# Patient Record
Sex: Female | Born: 1965 | Race: White | Hispanic: No | Smoking: Former smoker
Health system: Southern US, Community
[De-identification: ages and names within clinical notes are randomized; demographics above are authoritative.]

## PROBLEM LIST (undated history)

## (undated) DIAGNOSIS — Z923 Personal history of irradiation: Secondary | ICD-10-CM

## (undated) DIAGNOSIS — I10 Essential (primary) hypertension: Secondary | ICD-10-CM

## (undated) DIAGNOSIS — E039 Hypothyroidism, unspecified: Secondary | ICD-10-CM

## (undated) DIAGNOSIS — C50919 Malignant neoplasm of unspecified site of unspecified female breast: Secondary | ICD-10-CM

## (undated) DIAGNOSIS — R011 Cardiac murmur, unspecified: Secondary | ICD-10-CM

## (undated) HISTORY — DX: Personal history of irradiation: Z92.3

## (undated) HISTORY — DX: Malignant neoplasm of unspecified site of unspecified female breast: C50.919

## (undated) HISTORY — PX: COLONOSCOPY: SHX174

## (undated) HISTORY — DX: Essential (primary) hypertension: I10

## (undated) HISTORY — PX: WISDOM TOOTH EXTRACTION: SHX21

---

## 1998-02-12 ENCOUNTER — Other Ambulatory Visit: Admission: RE | Admit: 1998-02-12 | Discharge: 1998-02-12 | Payer: Self-pay | Admitting: Obstetrics and Gynecology

## 1999-03-04 ENCOUNTER — Other Ambulatory Visit: Admission: RE | Admit: 1999-03-04 | Discharge: 1999-03-04 | Payer: Self-pay | Admitting: Obstetrics and Gynecology

## 2000-04-05 ENCOUNTER — Other Ambulatory Visit: Admission: RE | Admit: 2000-04-05 | Discharge: 2000-04-05 | Payer: Self-pay | Admitting: Obstetrics and Gynecology

## 2001-04-06 ENCOUNTER — Other Ambulatory Visit: Admission: RE | Admit: 2001-04-06 | Discharge: 2001-04-06 | Payer: Self-pay | Admitting: Obstetrics and Gynecology

## 2003-08-13 ENCOUNTER — Other Ambulatory Visit: Admission: RE | Admit: 2003-08-13 | Discharge: 2003-08-13 | Payer: Self-pay | Admitting: Obstetrics and Gynecology

## 2004-09-07 ENCOUNTER — Other Ambulatory Visit: Admission: RE | Admit: 2004-09-07 | Discharge: 2004-09-07 | Payer: Self-pay | Admitting: Obstetrics and Gynecology

## 2005-10-07 ENCOUNTER — Other Ambulatory Visit: Admission: RE | Admit: 2005-10-07 | Discharge: 2005-10-07 | Payer: Self-pay | Admitting: Obstetrics and Gynecology

## 2006-11-15 ENCOUNTER — Encounter: Admission: RE | Admit: 2006-11-15 | Discharge: 2006-11-15 | Payer: Self-pay | Admitting: Obstetrics and Gynecology

## 2010-04-09 ENCOUNTER — Encounter: Admission: RE | Admit: 2010-04-09 | Discharge: 2010-04-09 | Payer: Self-pay | Admitting: Obstetrics and Gynecology

## 2010-11-21 ENCOUNTER — Encounter: Payer: Self-pay | Admitting: Obstetrics and Gynecology

## 2014-04-08 ENCOUNTER — Ambulatory Visit (HOSPITAL_COMMUNITY): Payer: BC Managed Care – PPO | Attending: Cardiovascular Disease | Admitting: Cardiology

## 2014-04-08 ENCOUNTER — Other Ambulatory Visit (HOSPITAL_COMMUNITY): Payer: Self-pay | Admitting: Obstetrics and Gynecology

## 2014-04-08 DIAGNOSIS — R011 Cardiac murmur, unspecified: Secondary | ICD-10-CM

## 2014-04-08 NOTE — Progress Notes (Signed)
Echo performed. 

## 2018-04-05 ENCOUNTER — Other Ambulatory Visit: Payer: Self-pay | Admitting: Obstetrics and Gynecology

## 2018-04-05 DIAGNOSIS — R17 Unspecified jaundice: Secondary | ICD-10-CM

## 2018-04-13 ENCOUNTER — Other Ambulatory Visit: Payer: Self-pay

## 2018-04-24 ENCOUNTER — Ambulatory Visit
Admission: RE | Admit: 2018-04-24 | Discharge: 2018-04-24 | Disposition: A | Discharge status: Home / Self Care | Payer: BLUE CROSS/BLUE SHIELD | Source: Ambulatory Visit | Attending: Obstetrics and Gynecology | Admitting: Obstetrics and Gynecology

## 2018-04-24 DIAGNOSIS — R17 Unspecified jaundice: Secondary | ICD-10-CM

## 2020-08-31 HISTORY — PX: BREAST BIOPSY: SHX20

## 2020-09-09 ENCOUNTER — Other Ambulatory Visit: Payer: Self-pay | Admitting: Obstetrics and Gynecology

## 2020-09-09 DIAGNOSIS — R928 Other abnormal and inconclusive findings on diagnostic imaging of breast: Secondary | ICD-10-CM

## 2020-09-23 ENCOUNTER — Other Ambulatory Visit: Payer: Self-pay | Admitting: Obstetrics and Gynecology

## 2020-09-23 ENCOUNTER — Ambulatory Visit
Admission: RE | Admit: 2020-09-23 | Discharge: 2020-09-23 | Disposition: A | Discharge status: Home / Self Care | Payer: BLUE CROSS/BLUE SHIELD | Source: Ambulatory Visit | Attending: Obstetrics and Gynecology | Admitting: Obstetrics and Gynecology

## 2020-09-23 ENCOUNTER — Other Ambulatory Visit: Payer: Self-pay

## 2020-09-23 DIAGNOSIS — R928 Other abnormal and inconclusive findings on diagnostic imaging of breast: Secondary | ICD-10-CM

## 2020-09-28 ENCOUNTER — Other Ambulatory Visit: Payer: Self-pay | Admitting: Obstetrics and Gynecology

## 2020-09-28 ENCOUNTER — Ambulatory Visit
Admission: RE | Admit: 2020-09-28 | Discharge: 2020-09-28 | Disposition: A | Discharge status: Home / Self Care | Payer: BC Managed Care – PPO | Source: Ambulatory Visit | Attending: Obstetrics and Gynecology | Admitting: Obstetrics and Gynecology

## 2020-09-28 ENCOUNTER — Other Ambulatory Visit: Payer: Self-pay

## 2020-09-28 DIAGNOSIS — R928 Other abnormal and inconclusive findings on diagnostic imaging of breast: Secondary | ICD-10-CM

## 2020-09-30 ENCOUNTER — Telehealth: Payer: Self-pay | Admitting: Oncology

## 2020-09-30 NOTE — Telephone Encounter (Signed)
LVM for patient to return call in reference to afternoon [facility] appointment for 12/8, mailing packet out

## 2020-10-01 ENCOUNTER — Encounter: Payer: Self-pay | Admitting: *Deleted

## 2020-10-01 DIAGNOSIS — Z170 Estrogen receptor positive status [ER+]: Secondary | ICD-10-CM | POA: Insufficient documentation

## 2020-10-01 DIAGNOSIS — C50411 Malignant neoplasm of upper-outer quadrant of right female breast: Secondary | ICD-10-CM

## 2020-10-06 ENCOUNTER — Other Ambulatory Visit: Payer: Self-pay | Admitting: *Deleted

## 2020-10-06 DIAGNOSIS — Z170 Estrogen receptor positive status [ER+]: Secondary | ICD-10-CM

## 2020-10-06 DIAGNOSIS — C50411 Malignant neoplasm of upper-outer quadrant of right female breast: Secondary | ICD-10-CM

## 2020-10-06 NOTE — Progress Notes (Signed)
[place]  [phone] [phone]     ID: Ariel Bell DOB: [birth year]  MR#: [id number]  MOL#:[id number]  Patient Care Team: Patient, No Pcp Per as PCP - General (General Practice) Mauro Kaufmann, RN as Oncology Nurse Navigator Rockwell Germany, RN as Oncology Nurse Navigator Gery Pray, MD as Consulting Physician (Radiation Oncology) Attila Mccarthy, Virgie Dad, MD as Consulting Physician (Oncology) Rolm Bookbinder, MD as Consulting Physician (General Surgery) Ulla Gallo, MD as Consulting Physician (Dermatology) Chauncey Cruel, MD OTHER MD:  CHIEF COMPLAINT: estrogen receptor positive breast cancer  CURRENT TREATMENT: Awaiting definitive surgery   HISTORY OF CURRENT ILLNESS: Ariel Bell had routine screening mammography on 09/02/2020 showing a possible abnormality in the right breast. She underwent right diagnostic mammography with tomography and right breast ultrasonography at [facility] on 09/23/2020 showing: breast density category C; 5 mm mass involving upper-outer right breast with a vague shadowing focus which is likely the sonographic correlate; no pathologic right axillary lymphadenopathy.  Accordingly on 09/28/2020 she proceeded to biopsy of the right breast area in question. The pathology from this procedure ([id number]) showed: invasive and in situ mammary carcinoma, e-cadherin positive, grade 2. Prognostic indicators significant for: estrogen receptor, 100% positive and progesterone receptor, 90% positive, both with strong staining intensity. Proliferation marker Ki67 at 10%. HER2 equivocal by immunohistochemistry (2+), but negative by fluorescent in situ hybridization with a signals ratio 1.41 and number per cell 2.05.  The patient's subsequent history is as detailed below.   INTERVAL HISTORY: [name] was evaluated in the multidisciplinary breast cancer clinic on 10/07/2020 accompanied by her sister Ariel Bell. Her case was also  presented at the multidisciplinary breast cancer conference on the same day. At that time a preliminary plan was proposed: Breast conserving surgery with sentinel lymph node sampling, adjuvant radiation, consideration of antiestrogens   REVIEW OF SYSTEMS: There were no specific symptoms leading to the original mammogram, which was routinely scheduled. The patient denies unusual headaches, visual changes, nausea, vomiting, stiff neck, dizziness, or gait imbalance. There has been no cough, phlegm production, or pleurisy, no chest pain or pressure, and no change in bowel or bladder habits. The patient denies fever, rash, bleeding, unexplained fatigue or unexplained weight loss. A detailed review of systems was otherwise entirely negative.   COVID 19 VACCINATION STATUS:    PAST MEDICAL HISTORY: Past Medical History:  Diagnosis Date  . Breast cancer ([place])   . Hypertension     PAST SURGICAL HISTORY: History reviewed. No pertinent surgical history.  FAMILY HISTORY: Family History  Problem Relation Age of Onset  . COPD Father    Her father died in his 24's from COPD/E. Her mother is age 58 as of 09/2020. [name] has two brothers and one sister. There is no family history of cancer to her knowledge.    GYNECOLOGIC HISTORY:  No LMP recorded. Menarche: 54 years old [place] P 0 LMP age 56 HRT never used  Hysterectomy? no BSO? 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 She is divorced. She is currently staying with her niece Ariel Bell because she just sold her house, but she is planning to move out as soon as she finds her own place to live.    ADVANCED DIRECTIVES: not in place, plans to name her sister Ariel Bell as her HCPOA.   HEALTH MAINTENANCE:     Colonoscopy: 2018?  PAP: up to date  Bone density: osteopenia  No Known Allergies  Current Outpatient Medications  Medication Sig Dispense Refill  .  hydrochlorothiazide (HYDRODIURIL) 25 MG tablet Take 25 mg by mouth daily.    Marland Kitchen ibandronate (BONIVA) 150 MG tablet Take 150 mg by mouth every 30 (thirty) days.    Marland Kitchen SYNTHROID 75 MCG tablet Take 75 mcg by mouth daily.     No current facility-administered medications for this visit.    OBJECTIVE: White woman in no acute distress  Vitals:   10/07/20 1311  BP: (!) 148/74  Pulse: 76  Resp: 17  Temp: 98.6 F (37 C)  SpO2: 100%     Body mass index is 25.35 kg/m.   Wt Readings from Last 3 Encounters:  10/07/20 138 lb 9.6 oz (62.9 kg)      ECOG FS:1 - Symptomatic but completely ambulatory  Ocular: Sclerae unicteric, pupils round and equal Ear-nose-throat: Wearing a mask Lymphatic: No cervical or supraclavicular adenopathy Lungs no rales or rhonchi Heart regular rate and rhythm Abd soft, nontender, positive bowel sounds MSK no focal spinal tenderness, no joint edema Neuro: non-focal, well-oriented, appropriate affect Breasts: The right breast is status Bell recent biopsy.  There is no ecchymosis or palpable mass, and no skin or nipple changes of concern.  The left breast is benign.  Both axillae are benign   LAB RESULTS:  CMP     Component Value Date/Time   NA 141 10/07/2020 1235   K 3.6 10/07/2020 1235   CL 101 10/07/2020 1235   CO2 29 10/07/2020 1235   GLUCOSE 96 10/07/2020 1235   BUN 13 10/07/2020 1235   CREATININE 0.96 10/07/2020 1235   CALCIUM 10.1 10/07/2020 1235   PROT 7.4 10/07/2020 1235   ALBUMIN 4.2 10/07/2020 1235   AST 21 10/07/2020 1235   ALT 16 10/07/2020 1235   ALKPHOS 68 10/07/2020 1235   BILITOT 1.4 (H) 10/07/2020 1235   GFRNONAA >60 10/07/2020 1235    No results found for: TOTALPROTELP, ALBUMINELP, A1GS, A2GS, BETS, BETA2SER, GAMS, MSPIKE, SPEI  Lab Results  Component Value Date   WBC 4.7 10/07/2020   NEUTROABS 2.5 10/07/2020   HGB 14.1 10/07/2020   HCT 42.1 10/07/2020   MCV 99.5 10/07/2020   PLT 206 10/07/2020    No results found for:  LABCA2  No components found for: XLKGMW102  No results for input(s): INR in the last 168 hours.  No results found for: LABCA2  No results found for: VOZ366  No results found for: YQI347  No results found for: QQV956  No results found for: CA2729  No components found for: HGQUANT  No results found for: CEA1 / No results found for: CEA1   No results found for: AFPTUMOR  No results found for: CHROMOGRNA  No results found for: KPAFRELGTCHN, LAMBDASER, KAPLAMBRATIO (kappa/lambda light chains)  No results found for: HGBA, HGBA2QUANT, HGBFQUANT, HGBSQUAN (Hemoglobinopathy evaluation)   No results found for: LDH  No results found for: IRON, TIBC, IRONPCTSAT (Iron and TIBC)  No results found for: FERRITIN  Urinalysis No results found for: COLORURINE, APPEARANCEUR, LABSPEC, PHURINE, GLUCOSEU, HGBUR, BILIRUBINUR, KETONESUR, PROTEINUR, UROBILINOGEN, NITRITE, LEUKOCYTESUR   STUDIES: US BREAST LTD UNI RIGHT INC AXILLA  Result Date: 09/23/2020 CLINICAL DATA:  Recall from screening mammography with tomosynthesis, possible mass or asymmetry in the UPPER RIGHT breast at POSTERIOR depth visible only on the MLO images. EXAM: DIGITAL DIAGNOSTIC RIGHT MAMMOGRAM WITH CAD AND TOMO ULTRASOUND RIGHT BREAST COMPARISON:  Previous exam(s). ACR Breast Density Category c: The breast tissue is heterogeneously  dense, which may obscure small masses. FINDINGS: Tomosynthesis and synthesized digital 2D spot compression MLO view of the area of concern in the RIGHT breast, tomosynthesis and synthesized digital 2D full field mediolateral and laterally exaggerated CC views of the RIGHT breast, and tomosynthesis and synthesized digital 2D spot compression laterally exaggerated CC view of the area of concern in the RIGHT breast were obtained. Mammographic images were processed with CAD. The mass or asymmetry persists on the spot compression MLO images. On the laterally exaggerated CC images, the mass localizes to  the OUTER breast and on the full field mediolateral images, the mass remains in the UPPER breast, indicating its location in the [place]. The mass measures approximately 5 mm and has vague margins on the spot compression images. There is no associated architectural distortion or suspicious calcifications. Targeted ultrasound is performed, showing a vague shadowing focus at the 10 o'clock position approximately 5 cm from nipple at POSTERIOR depth, best seen on the images obtained with harmonics, measuring approximately 3 mm, likely the sonographic correlate for the mammographic mass. Sonographic evaluation of the RIGHT axilla demonstrates no pathologic lymphadenopathy. IMPRESSION: 1. New suspicious 5 mm mass involving the UPPER OUTER QUADRANT of the RIGHT breast with a vague shadowing focus which is the likely sonographic correlate. 2. No pathologic RIGHT axillary lymphadenopathy. RECOMMENDATION: Since the mass is more conspicuous on the mammogram as opposed to the vague focus on ultrasound, stereotactic tomosynthesis core needle biopsy of the RIGHT breast mass is recommended. The stereotactic core needle biopsy procedure was discussed with the patient and her questions were answered. She wishes to proceed and the biopsy has been scheduled at her convenience. I have discussed the findings and recommendations with the patient. BI-RADS CATEGORY  4: Suspicious. Electronically Signed   By: Evangeline Dakin M.D.   On: 09/23/2020 11:08   MM DIAG BREAST TOMO UNI RIGHT  Result Date: 09/23/2020 CLINICAL DATA:  Recall from screening mammography with tomosynthesis, possible mass or asymmetry in the UPPER RIGHT breast at POSTERIOR depth visible only on the MLO images. EXAM: DIGITAL DIAGNOSTIC RIGHT MAMMOGRAM WITH CAD AND TOMO ULTRASOUND RIGHT BREAST COMPARISON:  Previous exam(s). ACR Breast Density Category c: The breast tissue is heterogeneously dense, which may obscure small masses. FINDINGS: Tomosynthesis  and synthesized digital 2D spot compression MLO view of the area of concern in the RIGHT breast, tomosynthesis and synthesized digital 2D full field mediolateral and laterally exaggerated CC views of the RIGHT breast, and tomosynthesis and synthesized digital 2D spot compression laterally exaggerated CC view of the area of concern in the RIGHT breast were obtained. Mammographic images were processed with CAD. The mass or asymmetry persists on the spot compression MLO images. On the laterally exaggerated CC images, the mass localizes to the OUTER breast and on the full field mediolateral images, the mass remains in the UPPER breast, indicating its location in the [place]. The mass measures approximately 5 mm and has vague margins on the spot compression images. There is no associated architectural distortion or suspicious calcifications. Targeted ultrasound is performed, showing a vague shadowing focus at the 10 o'clock position approximately 5 cm from nipple at POSTERIOR depth, best seen on the images obtained with harmonics, measuring approximately 3 mm, likely the sonographic correlate for the mammographic mass. Sonographic evaluation of the RIGHT axilla demonstrates no pathologic lymphadenopathy. IMPRESSION: 1. New suspicious 5 mm mass involving the UPPER OUTER QUADRANT of the RIGHT breast with a vague shadowing focus which is the likely sonographic  correlate. 2. No pathologic RIGHT axillary lymphadenopathy. RECOMMENDATION: Since the mass is more conspicuous on the mammogram as opposed to the vague focus on ultrasound, stereotactic tomosynthesis core needle biopsy of the RIGHT breast mass is recommended. The stereotactic core needle biopsy procedure was discussed with the patient and her questions were answered. She wishes to proceed and the biopsy has been scheduled at her convenience. I have discussed the findings and recommendations with the patient. BI-RADS CATEGORY  4: Suspicious. Electronically  Signed   By: Evangeline Dakin M.D.   On: 09/23/2020 11:08   MM CLIP PLACEMENT RIGHT  Result Date: 09/28/2020 CLINICAL DATA:  Status Bell stereotactic guided core biopsy of RIGHT breast mass. EXAM: DIAGNOSTIC RIGHT MAMMOGRAM Bell STEREOTACTIC BIOPSY COMPARISON:  Previous exam(s). FINDINGS: Mammographic images were obtained following stereotactic guided biopsy of mass in the UPPER-OUTER QUADRANT of the RIGHT breast and placement of a coil shaped clip. The biopsy marking clip is in expected position at the site of biopsy. IMPRESSION: Appropriate positioning of the coil shaped biopsy marking clip at the site of biopsy in the posterior UPPER OUTER QUADRANT RIGHT breast. Final Assessment: Bell Procedure Mammograms for Marker Placement Electronically Signed   By: Nolon Nations M.D.   On: 09/28/2020 11:16   MM RT BREAST BX W LOC DEV 1ST LESION IMAGE BX SPEC STEREO GUIDE  Addendum Date: 09/29/2020   ADDENDUM REPORT: 09/29/2020 11:41 ADDENDUM: Pathology revealed GRADE II INVASIVE MAMMARY CARCINOMA, MAMMARY CARCINOMA IN SITU WITH NECROSIS of the RIGHT breast, upper outer quadrant. This was found to be concordant by Dr. Nolon Nations. Pathology results were discussed with the patient by telephone. The patient reported doing well after the biopsy with tenderness at the site. Bell biopsy instructions and care were reviewed and questions were answered. The patient was encouraged to call [facility] for any additional concerns. My direct phone number was provided. The patient was referred to [facility] on October 07, 2020. Pathology results reported by Terie Purser, RN on 09/29/2020. Electronically Signed   By: Nolon Nations M.D.   On: 09/29/2020 11:41   Result Date: 09/29/2020 CLINICAL DATA:  Patient presents for stereotactic guided core biopsy of RIGHT breast mass. EXAM: RIGHT BREAST STEREOTACTIC CORE  NEEDLE BIOPSY COMPARISON:  Previous exams. FINDINGS: The patient and I discussed the procedure of stereotactic-guided biopsy including benefits and alternatives. We discussed the high likelihood of a successful procedure. We discussed the risks of the procedure including infection, bleeding, tissue injury, clip migration, and inadequate sampling. Informed written consent was given. The usual time out protocol was performed immediately prior to the procedure. Using sterile technique and 1% lidocaine and 1% lidocaine with epinephrine as local anesthetic, under stereotactic guidance, a 9 gauge vacuum assisted device was used to perform core needle biopsy of mass in the posterior UPPER-OUTER QUADRANT of the RIGHT breast using a 9 LATERAL to MEDIAL approach. Specimen radiograph was performed showing calcifications in tissue samples. Specimens with calcifications are identified for pathology. Lesion quadrant: UPPER-OUTER QUADRANT RIGHT breast At the conclusion of the procedure, coil shaped tissue marker clip was deployed into the biopsy cavity. Follow-up 2-view mammogram was performed and dictated separately. IMPRESSION: Stereotactic-guided biopsy of RIGHT breast mass. No apparent complications. Electronically Signed: By: Nolon Nations M.D. On: 09/28/2020 11:15     ELIGIBLE FOR AVAILABLE RESEARCH PROTOCOL: AET  ASSESSMENT: 54 y.o. [place] woman status Bell right breast upper outer quadrant biopsy 09/28/2020 for  a clinical T1a N0, stage IA invasive ductal carcinoma, grade 2, estrogen and progesterone receptor positive, HER-2 negative, with an MIB-1 of 10%.  (1) definitive surgery pending  (2) adjuvant radiation to follow  (3) consider antiestrogens for prophylaxis  PLAN: I met today with Shaneisha to review her new diagnosis. Specifically we discussed the biology of her breast cancer, its diagnosis, staging, treatment  options and prognosis. We first reviewed the fact that cancer is not one disease but  more than 100 different diseases and that it is important to keep them separate-- otherwise when friends and relatives discuss their own cancer experiences with [place] confusion can result. Similarly we explained that if breast cancer spreads to the bone or liver, the patient would not have bone cancer or liver cancer, but breast cancer in the bone and breast cancer in the liver: one cancer in three places-- not 3 different cancers which otherwise would have to be treated in 3 different ways.  We discussed the difference between local and systemic therapy. In terms of loco-regional treatment, lumpectomy plus radiation is equivalent to mastectomy as far as survival is concerned. For this reason, and because the cosmetic results are generally superior, we recommend breast conserving surgery. We also noted that in terms of sequencing of treatments, whether systemic therapy or surgery is done first does not affect the ultimate outcome.  We then discussed the rationale for systemic therapy. There is some risk that this cancer may have already spread to other parts of her body. Patients frequently ask at this point about bone scans, CAT scans and PET scans to find out if they have occult breast cancer somewhere else. The problem is that in early stage disease we are much more likely to find false positives then true cancers and this would expose the patient to unnecessary procedures as well as unnecessary radiation. Scans cannot answer the question the patient really would like to know, which is whether she has microscopic disease elsewhere in her body. For those reasons we do not recommend them.  Of course we would proceed to aggressive evaluation of any symptoms that might suggest metastatic disease, but that is not the case here.  Next we went over the options for systemic therapy which are anti-estrogens, anti-HER-2 immunotherapy, and chemotherapy.   Dorthia does not meet criteria for anti-HER-2  immunotherapy.  Chemotherapy is not used in T1a cases especially if they are estrogen receptor positive  Her prognosis with local treatment only is so good that antiestrogens are optional.  She may take them particularly for prophylaxis, namely to prevent a future breast cancer from developing.  We will discuss this option after she has completed her surgery and radiation treatments.  Leonie has a good understanding of the overall plan. She agrees with it. She knows the goal of treatment in her case is cure. She will call with any problems that may develop before her next visit here.  Total encounter time 65 minutes.Sarajane Jews C. Hakeen Shipes, MD 10/08/2020 7:35 AM Medical Oncology and Hematology [facility] [place], [place] [address] Tel. [phone]    Fax. [phone]   This document serves as a record of services personally performed by Lurline Del, MD. It was created on his behalf by Wilburn Mylar, a trained medical scribe. The creation of this record is based on the scribe's personal observations and the provider's statements to them.   Lindie Spruce MD, have reviewed the above documentation for accuracy and  completeness, and I agree with the above.    *Total Encounter Time as defined by the Centers for Medicare and Medicaid Services includes, in addition to the face-to-face time of a patient visit (documented in the note above) non-face-to-face time: obtaining and reviewing outside history, ordering and reviewing medications, tests or procedures, care coordination (communications with other health care professionals or caregivers) and documentation in the medical record.

## 2020-10-07 ENCOUNTER — Ambulatory Visit: Payer: BC Managed Care – PPO | Attending: General Surgery | Admitting: Physical Therapy

## 2020-10-07 ENCOUNTER — Encounter: Payer: Self-pay | Admitting: Oncology

## 2020-10-07 ENCOUNTER — Inpatient Hospital Stay: Payer: BC Managed Care – PPO

## 2020-10-07 ENCOUNTER — Ambulatory Visit
Admission: RE | Admit: 2020-10-07 | Discharge: 2020-10-07 | Disposition: A | Discharge status: Home / Self Care | Payer: BC Managed Care – PPO | Source: Ambulatory Visit | Attending: Radiation Oncology | Admitting: Radiation Oncology

## 2020-10-07 ENCOUNTER — Encounter: Payer: Self-pay | Admitting: General Practice

## 2020-10-07 ENCOUNTER — Inpatient Hospital Stay: Payer: BC Managed Care – PPO | Attending: Oncology | Admitting: Oncology

## 2020-10-07 ENCOUNTER — Other Ambulatory Visit: Payer: Self-pay | Admitting: General Surgery

## 2020-10-07 ENCOUNTER — Other Ambulatory Visit: Payer: Self-pay

## 2020-10-07 ENCOUNTER — Encounter: Payer: Self-pay | Admitting: Physical Therapy

## 2020-10-07 ENCOUNTER — Other Ambulatory Visit: Payer: Self-pay | Admitting: *Deleted

## 2020-10-07 VITALS — BP 148/74 | HR 76 | Temp 98.6°F | Resp 17 | Ht 62.0 in | Wt 138.6 lb

## 2020-10-07 DIAGNOSIS — C50411 Malignant neoplasm of upper-outer quadrant of right female breast: Secondary | ICD-10-CM

## 2020-10-07 DIAGNOSIS — I10 Essential (primary) hypertension: Secondary | ICD-10-CM | POA: Diagnosis not present

## 2020-10-07 DIAGNOSIS — Z170 Estrogen receptor positive status [ER+]: Secondary | ICD-10-CM | POA: Insufficient documentation

## 2020-10-07 DIAGNOSIS — R293 Abnormal posture: Secondary | ICD-10-CM

## 2020-10-07 LAB — CBC WITH DIFFERENTIAL (CANCER CENTER ONLY)
Abs Immature Granulocytes: 0 10*3/uL (ref 0.00–0.07)
Basophils Absolute: 0.1 10*3/uL (ref 0.0–0.1)
Basophils Relative: 1 %
Eosinophils Absolute: 0.2 10*3/uL (ref 0.0–0.5)
Eosinophils Relative: 4 %
HCT: 42.1 % (ref 36.0–46.0)
Hemoglobin: 14.1 g/dL (ref 12.0–15.0)
Immature Granulocytes: 0 %
Lymphocytes Relative: 35 %
Lymphs Abs: 1.7 10*3/uL (ref 0.7–4.0)
MCH: 33.3 pg (ref 26.0–34.0)
MCHC: 33.5 g/dL (ref 30.0–36.0)
MCV: 99.5 fL (ref 80.0–100.0)
Monocytes Absolute: 0.3 10*3/uL (ref 0.1–1.0)
Monocytes Relative: 7 %
Neutro Abs: 2.5 10*3/uL (ref 1.7–7.7)
Neutrophils Relative %: 53 %
Platelet Count: 206 10*3/uL (ref 150–400)
RBC: 4.23 MIL/uL (ref 3.87–5.11)
RDW: 12.5 % (ref 11.5–15.5)
WBC Count: 4.7 10*3/uL (ref 4.0–10.5)
nRBC: 0 % (ref 0.0–0.2)

## 2020-10-07 LAB — CMP (CANCER CENTER ONLY)
ALT: 16 U/L (ref 0–44)
AST: 21 U/L (ref 15–41)
Albumin: 4.2 g/dL (ref 3.5–5.0)
Alkaline Phosphatase: 68 U/L (ref 38–126)
Anion gap: 11 (ref 5–15)
BUN: 13 mg/dL (ref 6–20)
CO2: 29 mmol/L (ref 22–32)
Calcium: 10.1 mg/dL (ref 8.9–10.3)
Chloride: 101 mmol/L (ref 98–111)
Creatinine: 0.96 mg/dL (ref 0.44–1.00)
GFR, Estimated: >60 mL/min (ref >60)
Glucose, Bld: 96 mg/dL (ref 70–99)
Potassium: 3.6 mmol/L (ref 3.5–5.1)
Sodium: 141 mmol/L (ref 135–145)
Total Bilirubin: 1.4 mg/dL — ABNORMAL HIGH (ref 0.3–1.2)
Total Protein: 7.4 g/dL (ref 6.5–8.1)

## 2020-10-07 LAB — GENETIC SCREENING ORDER

## 2020-10-07 NOTE — Progress Notes (Signed)
[place] Psychosocial Distress Screening Spiritual Care  Shadowed by Counseling Intern Gaylyn Rong, met with Ariel Bell and her sister in Breast Multidisciplinary Clinic to introduce [facility] team/resources, reviewing distress screen per protocol.  The patient scored a 5 on the Psychosocial Distress Thermometer which indicates moderate distress. Also assessed for distress and other psychosocial needs.   ONCBCN DISTRESS SCREENING 10/07/2020  Screening Type Initial Screening  Distress experienced in past week (1-10) 5  Practical problem type Work/school  Emotional problem type Nervousness/Anxiety;Adjusting to illness  Information Concerns Type Lack of info about diagnosis;Lack of info about treatment  Referral to support programs Yes   Ariel Bell reports good support from her sister and family. She notes that meeting team and learning treatment plan helped reduce her distress, describing herself as "confident" in her care.  Follow up needed: No. Per Ariel Bell, no other needs at this time, but she is aware of ongoing team availability should needs arise or circumstances change.   [place], [place], [facility] Pager [phone] Voicemail [phone]

## 2020-10-07 NOTE — Patient Instructions (Signed)

## 2020-10-07 NOTE — Therapy (Signed)
[place], [place], [address] Phone: [phone]   Fax:  [phone]  Physical Therapy Evaluation  Patient Details  Name: Ariel Bell MRN: [id number] Date of Birth: 1966-04-28 Referring Provider (PT): Dr. Rolm Bookbinder   Encounter Date: 10/07/2020   PT End of Session - 10/07/20 1805    Visit Number 1    Number of Visits 2    Date for PT Re-Evaluation 12/02/20    PT Start Time 6010    PT Stop Time 9323   Also saw pt from 1356-1400 for a total of 36 min   PT Time Calculation (min) 32 min    Activity Tolerance Patient tolerated treatment well    Behavior During Therapy [facility] for tasks assessed/performed           Past Medical History:  Diagnosis Date  . Breast cancer ([place])   . Hypertension     History reviewed. No pertinent surgical history.  There were no vitals filed for this visit.    Subjective Assessment - 10/07/20 1753    Subjective Patient reports she is here today to be seen by her medical team for her newly diagnosed right breast cancer.    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 It measures 3 mm and is located in the upper outer quadrant. It is ER/PR positive and HER2 negative with a Ki67 of 10%.    Patient Stated Goals Reduce lymphedema risk and learn post op shoulder ROM HEP    Currently in Pain? No/denies              [facility] PT Assessment - 10/07/20 0001      Assessment   Medical Diagnosis Right breast cancer    Referring Provider (PT) Dr. Rolm Bookbinder    Onset Date/Surgical Date 09/02/20    Hand Dominance Right    Prior Therapy none      Precautions   Precautions Other (comment)    Precaution Comments active cancer      Restrictions   Weight Bearing Restrictions No      Balance Screen   Has the patient fallen in the past 6 months  No    Has the patient had a decrease in activity level because of a fear of falling?  No    Is the patient reluctant to leave their home because of a fear of falling?  No      Home Environment   Living Environment Private residence    Living Arrangements Other relatives   Niece   Available Help at Discharge Family      Prior Function   Level of Independence Independent    Vocation Full time employment    Printmaker full time; part-time at [employer] She used to walk regularly but nothing in the past year      Cognition   Overall Cognitive Status Within Functional Limits for tasks assessed      Posture/Postural Control   Posture/Postural Control Postural limitations    Postural Limitations Rounded Shoulders;Forward head      ROM / Strength   AROM / PROM / Strength AROM;Strength      AROM   Overall AROM Comments Cervical AROM is WNL    AROM Assessment Site Shoulder    Right/Left Shoulder Right;Left    Right Shoulder Extension 44  Degrees    Right Shoulder Flexion 154 Degrees    Right Shoulder ABduction 155 Degrees    Right Shoulder Internal Rotation 62 Degrees    Right Shoulder External Rotation 87 Degrees    Left Shoulder Extension 42 Degrees    Left Shoulder Flexion 138 Degrees    Left Shoulder ABduction 150 Degrees    Left Shoulder Internal Rotation 58 Degrees    Left Shoulder External Rotation 88 Degrees      Strength   Overall Strength Within functional limits for tasks performed             LYMPHEDEMA/ONCOLOGY QUESTIONNAIRE - 10/07/20 0001      Type   Cancer Type Right breast cancer      Lymphedema Assessments   Lymphedema Assessments Upper extremities      Right Upper Extremity Lymphedema   10 cm Proximal to Olecranon Process 26 cm    Olecranon Process 23 cm    10 cm Proximal to Ulnar Styloid Process 21.3 cm    Just Proximal to Ulnar Styloid Process 14.8 cm    Across Hand at [employer] 17.8 cm    At [place] of 2nd  Digit 6.1 cm      Left Upper Extremity Lymphedema   10 cm Proximal to Olecranon Process 26.5 cm    Olecranon Process 23.1 cm    10 cm Proximal to Ulnar Styloid Process 20.5 cm    Just Proximal to Ulnar Styloid Process 14.7 cm    Across Hand at [employer] 17.8 cm    At [place] of 2nd Digit 6.1 cm           L-DEX FLOWSHEETS - 10/07/20 1800      L-DEX LYMPHEDEMA SCREENING   Measurement Type Unilateral    POSITION  Standing    DOMINANT SIDE Right    At Risk Side Right    BASELINE SCORE (UNILATERAL) -13   Attempted 3x as she scored out of range but it was consisten               [place] - 10/07/20 0001    Open a tight or new jar No difficulty    Do heavy household chores (wash walls, wash floors) No difficulty    Carry a shopping bag or briefcase No difficulty    Wash your back No difficulty    Use a knife to cut food No difficulty    Recreational activities in which you take some force or impact through your arm, shoulder, or hand (golf, hammering, tennis) No difficulty    During the past week, to what extent has your arm, shoulder or hand problem interfered with your normal social activities with family, friends, neighbors, or groups? Not at all    During the past week, to what extent has your arm, shoulder or hand problem limited your work or other regular daily activities Not at all    Arm, shoulder, or hand pain. None    Tingling (pins and needles) in your arm, shoulder, or hand None    Difficulty Sleeping No difficulty    DASH Score 0 %            Objective measurements completed on examination: See above findings.       Patient was instructed today in a home exercise program today for post op shoulder range of motion. These included active assist shoulder flexion in sitting, scapular retraction, wall walking with shoulder abduction, and hands behind head  external rotation.  She was encouraged to do these twice a day, holding 3 seconds and repeating 5 times  when permitted by her physician.            PT Education - 10/07/20 1804    Education Details Lymphedema risk reduction and post op shoulder ROM HEP    Person(s) Educated Patient;Other (comment)   sister   Methods Explanation;Demonstration;Handout    Comprehension Verbalized understanding;Returned demonstration               PT Long Term Goals - 10/07/20 1845      PT LONG TERM GOAL #1   Title Patient will demonstrate she has regained full shoulder ROM and function post operatively compared to baselines.    Time 8    Period Weeks    Status New    Target Date 12/02/20           Breast Clinic Goals - 10/07/20 1845      Patient will be able to verbalize understanding of pertinent lymphedema risk reduction practices relevant to her diagnosis specifically related to skin care.   Time 1    Period Days    Status Achieved      Patient will be able to return demonstrate and/or verbalize understanding of the post-op home exercise program related to regaining shoulder range of motion.   Time 1    Period Days    Status Achieved      Patient will be able to verbalize understanding of the importance of attending the postoperative After Breast Cancer Class for further lymphedema risk reduction education and therapeutic exercise.   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 It measures 3 mm and is located in the upper outer quadrant. It is ER/PR positive and HER2 negative with a Ki67 of 10%. Her multidisciplinary medical team met prior to her assessments to determine a recommended treatment plan. She is planning to have a right lumpectomy and sentinel node biopsy followed by radiation and anti-estrogen therapy. She will benefit from a post op PT reassessment to determine needs and L-Dex screens every  3 months for 2 years to detect subclinical lymphedema.    Stability/Clinical Decision Making Stable/Uncomplicated    Clinical Decision Making Low    Rehab Potential Excellent    PT Frequency --   Eval and 1 f/u visit   PT Treatment/Interventions Therapeutic exercise;Patient/family education;ADLs/Self Care Home Management    PT Next Visit Plan Will reassess 3-4 weeks post op    PT Home Exercise Plan Post op shoulder ROM HEP    Consulted and Agree with Plan of Care Patient;Family member/caregiver    Family Member Consulted sister           Patient will benefit from skilled therapeutic intervention in order to improve the following deficits and impairments:  Postural dysfunction, Decreased range of motion, Decreased knowledge of precautions, Impaired UE functional use, Pain  Visit Diagnosis: Malignant neoplasm of upper-outer quadrant of right breast in female, estrogen receptor positive ([facility]) - Plan: PT plan of care cert/re-cert  Abnormal posture - Plan: PT plan of care cert/re-cert   Patient will follow up at outpatient cancer rehab 3-4 weeks following surgery.  If the patient requires physical  therapy at that time, a specific plan will be dictated and sent to the referring physician for approval. The patient was educated today on appropriate basic range of motion exercises to begin post operatively and the importance of attending the After Breast Cancer class following surgery.  Patient was educated today on lymphedema risk reduction practices as it pertains to recommendations that will benefit the patient immediately following surgery.  She verbalized good understanding.      Problem List Patient Active Problem List   Diagnosis Date Noted  . Malignant neoplasm of upper-outer quadrant of right breast in female, estrogen receptor positive ([place]) 10/01/2020   [name], PT 10/07/20 6:49 PM  [place], [place], [address] Phone: [phone]   Fax:  [phone]  Name: Ariel Bell MRN: [id number] Date of Birth: 06/25/1966

## 2020-10-07 NOTE — Progress Notes (Signed)
Radiation Oncology         [phone]) [phone] ________________________________  Multidisciplinary Breast Oncology Clinic [facility]) Initial Outpatient Consultation  Name: Ariel Bell MRN: [id number]  Date: 10/07/2020  DOB: October 05, 1966  YH:CWCBJSE, No Pcp Per  Rolm Bookbinder, MD   REFERRING PHYSICIAN: Rolm Bookbinder, MD  DIAGNOSIS: The encounter diagnosis was Malignant neoplasm of upper-outer quadrant of right breast in female, estrogen receptor positive ([place]).  Stage T1A, N0, Mx, Right Breast UOQ, Invasive Ductal Carcinoma with DCIS, ER+ / PR+ / Her2-, Grade 2    ICD-10-CM   1. Malignant neoplasm of upper-outer quadrant of right breast in female, estrogen receptor positive ([place])  C50.411    Z17.0     HISTORY OF PRESENT ILLNESS::Ariel Bell is a 54 y.o. female who is presenting to the office today for evaluation of her newly diagnosed breast cancer. She is accompanied by a good friend. She is doing well overall.   She had routine screening mammography on 09/02/2020 that showed a possible mass in the right breast. She underwent unilateral diagnostic mammography with tomography and right breast ultrasonography at [facility] on 09/23/2020 that showed a new suspicious 5 mm mass involving the upper outer quadrant of the right breast with a vague shadowing focus, which was likely the sonographic correlate. There was no pathologic right axillary lymphadenopathy.  Biopsy on 09/28/2020 showed: grade 2 invasive mammary carcinoma and mammary carcinoma in situ with necrosis. E-cadherin was strongly positive, consistent with a ductal phenotype. Prognostic indicators were significant for: estrogen receptor, 100% positive and progesterone receptor, 90% positive, both with strong staining intensities. Proliferation marker Ki67 at 10%. HER2 negative.  Menarche: 54 years old Age at first live birth: N/A GP: 0 LMP: Age 67 Contraceptive: None HRT: None   The patient was referred today for  presentation in the multidisciplinary conference.  Radiology studies and pathology slides were presented there for review and discussion of treatment options.  A consensus was discussed regarding potential next steps.  PREVIOUS RADIATION THERAPY: No  PAST MEDICAL HISTORY:  Past Medical History:  Diagnosis Date  . Breast cancer ([place])   . Hypertension     PAST SURGICAL HISTORY:No past surgical history on file.  FAMILY HISTORY:  Family History  Problem Relation Age of Onset  . COPD Father     SOCIAL HISTORY:  Social History   Socioeconomic History  . Marital status: Married    Spouse name: Not on file  . Number of children: Not on file  . Years of education: Not on file  . Highest education level: Not on file  Occupational History  . Not on file  Tobacco Use  . Smoking status: Not on file  Substance and Sexual Activity  . Alcohol use: Not on file  . Drug use: Not on file  . Sexual activity: Not on file  Other Topics Concern  . Not on file  Social History Narrative  . Not on file   Social Determinants of Health   Financial Resource Strain:   . Difficulty of Paying Living Expenses: Not on file  Food Insecurity:   . Worried About Charity fundraiser in the Last Year: Not on file  . Ran Out of Food in the Last Year: Not on file  Transportation Needs:   . Lack of Transportation (Medical): Not on file  . Lack of Transportation (Non-Medical): Not on file  Physical Activity:   . Days of Exercise per Week: Not on file  . Minutes of Exercise per  Session: Not on file  Stress:   . Feeling of Stress : Not on file  Social Connections:   . Frequency of Communication with Friends and Family: Not on file  . Frequency of Social Gatherings with Friends and Family: Not on file  . Attends Religious Services: Not on file  . Active Member of Clubs or Organizations: Not on file  . Attends Archivist Meetings: Not on file  . Marital Status: Not on file    ALLERGIES: No  Known Allergies  MEDICATIONS:  Current Outpatient Medications  Medication Sig Dispense Refill  . hydrochlorothiazide (HYDRODIURIL) 25 MG tablet Take 25 mg by mouth daily.    Marland Kitchen ibandronate (BONIVA) 150 MG tablet Take 150 mg by mouth every 30 (thirty) days.    Marland Kitchen SYNTHROID 75 MCG tablet Take 75 mcg by mouth daily.     No current facility-administered medications for this encounter.    REVIEW OF SYSTEMS: A 10+ POINT REVIEW OF SYSTEMS WAS OBTAINED including neurology, dermatology, psychiatry, cardiac, respiratory, lymph, extremities, GI, GU, musculoskeletal, constitutional, reproductive, HEENT. On the provided form, she reports no complaints. She denies fever, chills, nausea, vomiting, chest pain, shortness of breath, dysuria, breast pain, rash, and any other symptoms.    PHYSICAL EXAM:   Vitals with BMI 10/07/2020  Height '5\' 2"'   Weight 138 lbs 10 oz  BMI 24.49  Systolic 753  Diastolic 74  Pulse 76   Lungs are clear to auscultation bilaterally. Heart has regular rate and rhythm. No palpable cervical, supraclavicular, or axillary adenopathy. Abdomen soft, non-tender, normal bowel sounds. Left breast with no palpable mass, nipple discharge, or bleeding.  Right breast with bruising in the UOQ at the approximately 10 o'clock position with induration but no obvious discrete mass. There was no nipple discharge or bleeding.  KPS = 90  100 - Normal; no complaints; no evidence of disease. 90   - Able to carry on normal activity; minor signs or symptoms of disease. 80   - Normal activity with effort; some signs or symptoms of disease. 50   - Cares for self; unable to carry on normal activity or to do active work. 60   - Requires occasional assistance, but is able to care for most of his personal needs. 50   - Requires considerable assistance and frequent medical care. 32   - Disabled; requires special care and assistance. 69   - Severely disabled; hospital admission is indicated although death  not imminent. 16   - Very sick; hospital admission necessary; active supportive treatment necessary. 10   - Moribund; fatal processes progressing rapidly. 0     - Dead  Karnofsky DA, Abelmann Adak, Craver LS and Burchenal [facility] [phone]) The use of the nitrogen mustards in the palliative treatment of carcinoma: with particular reference to bronchogenic carcinoma Cancer 1 634-56  LABORATORY DATA:  Lab Results  Component Value Date   WBC 4.7 10/07/2020   HGB 14.1 10/07/2020   HCT 42.1 10/07/2020   MCV 99.5 10/07/2020   PLT 206 10/07/2020   Lab Results  Component Value Date   NA 141 10/07/2020   K 3.6 10/07/2020   CL 101 10/07/2020   CO2 29 10/07/2020   Lab Results  Component Value Date   ALT 16 10/07/2020   AST 21 10/07/2020   ALKPHOS 68 10/07/2020   BILITOT 1.4 (H) 10/07/2020    PULMONARY FUNCTION TEST:   Recent Review Flowsheet Data   There is no flowsheet data to display.  RADIOGRAPHY: US BREAST LTD UNI RIGHT INC AXILLA  Result Date: 09/23/2020 CLINICAL DATA:  Recall from screening mammography with tomosynthesis, possible mass or asymmetry in the UPPER RIGHT breast at POSTERIOR depth visible only on the MLO images. EXAM: DIGITAL DIAGNOSTIC RIGHT MAMMOGRAM WITH CAD AND TOMO ULTRASOUND RIGHT BREAST COMPARISON:  Previous exam(s). ACR Breast Density Category c: The breast tissue is heterogeneously dense, which may obscure small masses. FINDINGS: Tomosynthesis and synthesized digital 2D spot compression MLO view of the area of concern in the RIGHT breast, tomosynthesis and synthesized digital 2D full field mediolateral and laterally exaggerated CC views of the RIGHT breast, and tomosynthesis and synthesized digital 2D spot compression laterally exaggerated CC view of the area of concern in the RIGHT breast were obtained. Mammographic images were processed with CAD. The mass or asymmetry persists on the spot compression MLO images. On the laterally exaggerated CC images, the mass  localizes to the OUTER breast and on the full field mediolateral images, the mass remains in the UPPER breast, indicating its location in the [place]. The mass measures approximately 5 mm and has vague margins on the spot compression images. There is no associated architectural distortion or suspicious calcifications. Targeted ultrasound is performed, showing a vague shadowing focus at the 10 o'clock position approximately 5 cm from nipple at POSTERIOR depth, best seen on the images obtained with harmonics, measuring approximately 3 mm, likely the sonographic correlate for the mammographic mass. Sonographic evaluation of the RIGHT axilla demonstrates no pathologic lymphadenopathy. IMPRESSION: 1. New suspicious 5 mm mass involving the UPPER OUTER QUADRANT of the RIGHT breast with a vague shadowing focus which is the likely sonographic correlate. 2. No pathologic RIGHT axillary lymphadenopathy. RECOMMENDATION: Since the mass is more conspicuous on the mammogram as opposed to the vague focus on ultrasound, stereotactic tomosynthesis core needle biopsy of the RIGHT breast mass is recommended. The stereotactic core needle biopsy procedure was discussed with the patient and her questions were answered. She wishes to proceed and the biopsy has been scheduled at her convenience. I have discussed the findings and recommendations with the patient. BI-RADS CATEGORY  4: Suspicious. Electronically Signed   By: Evangeline Dakin M.D.   On: 09/23/2020 11:08   MM DIAG BREAST TOMO UNI RIGHT  Result Date: 09/23/2020 CLINICAL DATA:  Recall from screening mammography with tomosynthesis, possible mass or asymmetry in the UPPER RIGHT breast at POSTERIOR depth visible only on the MLO images. EXAM: DIGITAL DIAGNOSTIC RIGHT MAMMOGRAM WITH CAD AND TOMO ULTRASOUND RIGHT BREAST COMPARISON:  Previous exam(s). ACR Breast Density Category c: The breast tissue is heterogeneously dense, which may obscure small masses. FINDINGS:  Tomosynthesis and synthesized digital 2D spot compression MLO view of the area of concern in the RIGHT breast, tomosynthesis and synthesized digital 2D full field mediolateral and laterally exaggerated CC views of the RIGHT breast, and tomosynthesis and synthesized digital 2D spot compression laterally exaggerated CC view of the area of concern in the RIGHT breast were obtained. Mammographic images were processed with CAD. The mass or asymmetry persists on the spot compression MLO images. On the laterally exaggerated CC images, the mass localizes to the OUTER breast and on the full field mediolateral images, the mass remains in the UPPER breast, indicating its location in the [place]. The mass measures approximately 5 mm and has vague margins on the spot compression images. There is no associated architectural distortion or suspicious calcifications. Targeted ultrasound is performed, showing a vague shadowing focus at the 10 o'clock  position approximately 5 cm from nipple at POSTERIOR depth, best seen on the images obtained with harmonics, measuring approximately 3 mm, likely the sonographic correlate for the mammographic mass. Sonographic evaluation of the RIGHT axilla demonstrates no pathologic lymphadenopathy. IMPRESSION: 1. New suspicious 5 mm mass involving the UPPER OUTER QUADRANT of the RIGHT breast with a vague shadowing focus which is the likely sonographic correlate. 2. No pathologic RIGHT axillary lymphadenopathy. RECOMMENDATION: Since the mass is more conspicuous on the mammogram as opposed to the vague focus on ultrasound, stereotactic tomosynthesis core needle biopsy of the RIGHT breast mass is recommended. The stereotactic core needle biopsy procedure was discussed with the patient and her questions were answered. She wishes to proceed and the biopsy has been scheduled at her convenience. I have discussed the findings and recommendations with the patient. BI-RADS CATEGORY  4: Suspicious.  Electronically Signed   By: Evangeline Dakin M.D.   On: 09/23/2020 11:08   MM CLIP PLACEMENT RIGHT  Result Date: 09/28/2020 CLINICAL DATA:  Status post stereotactic guided core biopsy of RIGHT breast mass. EXAM: DIAGNOSTIC RIGHT MAMMOGRAM POST STEREOTACTIC BIOPSY COMPARISON:  Previous exam(s). FINDINGS: Mammographic images were obtained following stereotactic guided biopsy of mass in the UPPER-OUTER QUADRANT of the RIGHT breast and placement of a coil shaped clip. The biopsy marking clip is in expected position at the site of biopsy. IMPRESSION: Appropriate positioning of the coil shaped biopsy marking clip at the site of biopsy in the posterior UPPER OUTER QUADRANT RIGHT breast. Final Assessment: Post Procedure Mammograms for Marker Placement Electronically Signed   By: Nolon Nations M.D.   On: 09/28/2020 11:16   MM RT BREAST BX W LOC DEV 1ST LESION IMAGE BX SPEC STEREO GUIDE  Addendum Date: 09/29/2020   ADDENDUM REPORT: 09/29/2020 11:41 ADDENDUM: Pathology revealed GRADE II INVASIVE MAMMARY CARCINOMA, MAMMARY CARCINOMA IN SITU WITH NECROSIS of the RIGHT breast, upper outer quadrant. This was found to be concordant by Dr. Nolon Nations. Pathology results were discussed with the patient by telephone. The patient reported doing well after the biopsy with tenderness at the site. Post biopsy instructions and care were reviewed and questions were answered. The patient was encouraged to call [facility] for any additional concerns. My direct phone number was provided. The patient was referred to [facility] on October 07, 2020. Pathology results reported by Terie Purser, RN on 09/29/2020. Electronically Signed   By: Nolon Nations M.D.   On: 09/29/2020 11:41   Result Date: 09/29/2020 CLINICAL DATA:  Patient presents for stereotactic guided core biopsy of RIGHT breast mass. EXAM: RIGHT BREAST  STEREOTACTIC CORE NEEDLE BIOPSY COMPARISON:  Previous exams. FINDINGS: The patient and I discussed the procedure of stereotactic-guided biopsy including benefits and alternatives. We discussed the high likelihood of a successful procedure. We discussed the risks of the procedure including infection, bleeding, tissue injury, clip migration, and inadequate sampling. Informed written consent was given. The usual time out protocol was performed immediately prior to the procedure. Using sterile technique and 1% lidocaine and 1% lidocaine with epinephrine as local anesthetic, under stereotactic guidance, a 9 gauge vacuum assisted device was used to perform core needle biopsy of mass in the posterior UPPER-OUTER QUADRANT of the RIGHT breast using a 9 LATERAL to MEDIAL approach. Specimen radiograph was performed showing calcifications in tissue samples. Specimens with calcifications are identified for pathology. Lesion quadrant: UPPER-OUTER QUADRANT RIGHT breast At the conclusion of the procedure,  coil shaped tissue marker clip was deployed into the biopsy cavity. Follow-up 2-view mammogram was performed and dictated separately. IMPRESSION: Stereotactic-guided biopsy of RIGHT breast mass. No apparent complications. Electronically Signed: By: Nolon Nations M.D. On: 09/28/2020 11:15      IMPRESSION: Stage T1A, N0, Mx, Right Breast UOQ, Invasive Ductal Carcinoma with DCIS, ER+ / PR+ / Her2-, Grade 2  The patient will be a good candidate for breast conservation with radiotherapy to the right breast. We discussed the general course of radiation, potential side effects, and toxicities with radiation and the patient is interested in this approach.   PLAN:  1. Right lumpectomy with sentinel lymph node biopsy 2. Adjuvant radiation therapy 3. Aromatase inhibitor   ------------------------------------------------  Blair Promise, PhD, MD  This document serves as a record of services personally performed by Gery Pray, MD. It was created on his behalf by Clerance Lav, a trained medical scribe. The creation of this record is based on the scribe's personal observations and the provider's statements to them. This document has been checked and approved by the attending provider.

## 2020-10-08 ENCOUNTER — Encounter: Payer: Self-pay | Admitting: *Deleted

## 2020-10-09 ENCOUNTER — Other Ambulatory Visit: Payer: Self-pay | Admitting: General Surgery

## 2020-10-09 DIAGNOSIS — C50411 Malignant neoplasm of upper-outer quadrant of right female breast: Secondary | ICD-10-CM

## 2020-10-12 ENCOUNTER — Encounter: Payer: Self-pay | Admitting: *Deleted

## 2020-10-12 ENCOUNTER — Other Ambulatory Visit (HOSPITAL_COMMUNITY)
Admission: RE | Admit: 2020-10-12 | Discharge: 2020-10-12 | Disposition: A | Discharge status: Home / Self Care | Payer: BC Managed Care – PPO | Source: Ambulatory Visit | Attending: General Surgery | Admitting: General Surgery

## 2020-10-12 DIAGNOSIS — C50411 Malignant neoplasm of upper-outer quadrant of right female breast: Secondary | ICD-10-CM

## 2020-10-12 DIAGNOSIS — Z01812 Encounter for preprocedural laboratory examination: Secondary | ICD-10-CM | POA: Insufficient documentation

## 2020-10-12 DIAGNOSIS — Z20822 Contact with and (suspected) exposure to covid-19: Secondary | ICD-10-CM | POA: Insufficient documentation

## 2020-10-12 NOTE — Progress Notes (Signed)
[id number] [address] [place] [address] [place] [address] Phone: [phone] Fax: [phone]      Your procedure is scheduled on Thursday December 16th .  Report to [facility] Main Entrance "A" at 10:30 A.M., and check in at the Admitting office.  Call this number if you have problems the morning of surgery:  [phone]  Call [phone] if you have any questions prior to your surgery date Monday-Friday 8am-4pm    Remember:  Do not eat after midnight the night before your surgery  You may drink clear liquids until 9:30am the morning of your surgery.   Clear liquids allowed are: Water, Non-Citrus Juices (without pulp), Carbonated Beverages, Clear Tea, Black Coffee Only, and Gatorade   Enhanced Recovery after Surgery Enhanced Recovery after Surgery is a protocol used to improve the stress on your body and your recovery after surgery.  Patient Instructions  . The night before surgery:  o No food after midnight. ONLY clear liquids after midnight  .  Marland Kitchen The day of surgery (if you do NOT have diabetes):  o Drink ONE (1) Pre-Surgery Clear Ensure by _____ am the morning of surgery   o This drink was given to you during your hospital  pre-op appointment visit. o Nothing else to drink after completing the  Pre-Surgery Clear Ensure.          If you have questions, please contact your surgeon's office.     Take these medicines the morning of surgery with A SIP OF WATER   cetirizine (ZYRTEC) 10 MG tablet  SYNTHROID 75 MCG tablet   As of today, STOP taking any Aspirin (unless otherwise instructed by your surgeon) Aleve, Naproxen, Ibuprofen, Motrin, Advil, Goody's, BC's, all herbal medications, fish oil, and all vitamins.                      Do not wear jewelry, make up, or nail polish            Do not wear lotions, powders, perfumes, or deodorant.            Do not shave 48 hours  prior to surgery.              Do not bring valuables to the hospital.            [facility] is not responsible for any belongings or valuables.  Do NOT Smoke (Tobacco/Vaping) or drink Alcohol 24 hours prior to your procedure If you use a CPAP at night, you may bring all equipment for your overnight stay.   Contacts, glasses, dentures or bridgework may not be worn into surgery.      For patients admitted to the hospital, discharge time will be determined by your treatment team.   Patients discharged the day of surgery will not be allowed to drive home, and someone needs to stay with them for 24 hours.    Special instructions:   [place]- Preparing For Surgery  Before surgery, you can play an important role. Because skin is not sterile, your skin needs to be as free of germs as possible. You can reduce the number of germs on your skin by washing with CHG (chlorahexidine gluconate) Soap before surgery.  CHG is an antiseptic cleaner which kills germs and bonds with the skin to continue killing germs even after washing.    Oral Hygiene is  also important to reduce your risk of infection.  Remember - BRUSH YOUR TEETH THE MORNING OF SURGERY WITH YOUR REGULAR TOOTHPASTE  Please do not use if you have an allergy to CHG or antibacterial soaps. If your skin becomes reddened/irritated stop using the CHG.  Do not shave (including legs and underarms) for at least 48 hours prior to first CHG shower. It is OK to shave your face.  Please follow these instructions carefully.   1. Shower the NIGHT BEFORE SURGERY and the MORNING OF SURGERY with CHG Soap.   2. If you chose to wash your hair, wash your hair first as usual with your normal shampoo.  3. After you shampoo, rinse your hair and body thoroughly to remove the shampoo.  4. Use CHG as you would any other liquid soap. You can apply CHG directly to the skin and wash gently with a scrungie or a clean washcloth.   5. Apply the CHG Soap to your  body ONLY FROM THE NECK DOWN.  Do not use on open wounds or open sores. Avoid contact with your eyes, ears, mouth and genitals (private parts). Wash Face and genitals (private parts)  with your normal soap.   6. Wash thoroughly, paying special attention to the area where your surgery will be performed.  7. Thoroughly rinse your body with warm water from the neck down.  8. DO NOT shower/wash with your normal soap after using and rinsing off the CHG Soap.  9. Pat yourself dry with a CLEAN TOWEL.  10. Wear CLEAN PAJAMAS to bed the night before surgery  11. Place CLEAN SHEETS on your bed the night of your first shower and DO NOT SLEEP WITH PETS.   Day of Surgery: Wear Clean/Comfortable clothing the morning of surgery Do not apply any deodorants/lotions.   Remember to brush your teeth WITH YOUR REGULAR TOOTHPASTE.   Please read over the following fact sheets that you were given.

## 2020-10-13 ENCOUNTER — Encounter (HOSPITAL_COMMUNITY)
Admission: RE | Admit: 2020-10-13 | Discharge: 2020-10-13 | Disposition: A | Discharge status: Home / Self Care | Payer: BC Managed Care – PPO | Source: Ambulatory Visit | Attending: General Surgery | Admitting: General Surgery

## 2020-10-13 ENCOUNTER — Other Ambulatory Visit: Payer: Self-pay

## 2020-10-13 ENCOUNTER — Encounter (HOSPITAL_COMMUNITY): Payer: Self-pay

## 2020-10-13 DIAGNOSIS — Z01818 Encounter for other preprocedural examination: Secondary | ICD-10-CM | POA: Insufficient documentation

## 2020-10-13 DIAGNOSIS — Z20822 Contact with and (suspected) exposure to covid-19: Secondary | ICD-10-CM | POA: Diagnosis not present

## 2020-10-13 DIAGNOSIS — I10 Essential (primary) hypertension: Secondary | ICD-10-CM | POA: Insufficient documentation

## 2020-10-13 DIAGNOSIS — C50411 Malignant neoplasm of upper-outer quadrant of right female breast: Secondary | ICD-10-CM | POA: Diagnosis not present

## 2020-10-13 HISTORY — DX: Cardiac murmur, unspecified: R01.1

## 2020-10-13 HISTORY — DX: Hypothyroidism, unspecified: E03.9

## 2020-10-13 LAB — SARS CORONAVIRUS 2 (TAT 6-24 HRS): SARS Coronavirus 2: NEGATIVE

## 2020-10-13 NOTE — Progress Notes (Addendum)
PCP - Dr. Arvella Nigh Cardiologist -   PPM/ICD - n/a Device Orders -  Rep Notified -   Chest x-ray - n/a EKG - 10/13/2020 - abnormal tracing, patient states she has not had an ekg since she was in her early 20's so no previous tracing to request Stress Test - patient denies ECHO - 2015 Cardiac Cath - patient denies  Sleep Study - patient denies CPAP -   Fasting Blood Sugar - n/a Checks Blood Sugar _____ times a day  Blood Thinner Instructions: n/a Aspirin Instructions: n/a  ERAS Protcol - clears until 0930 PRE-SURGERY Ensure or G2- ensure provided to patient, instructed to complete by 0930  COVID TEST- negative on 10/12/2020   Anesthesia review: n/a  Patient denies shortness of breath, fever, cough and chest pain at PAT appointment   All instructions explained to the patient, with a verbal understanding of the material. Patient agrees to go over the instructions while at home for a better understanding. Patient also instructed to self quarantine after being tested for COVID-19. The opportunity to ask questions was provided.

## 2020-10-14 ENCOUNTER — Ambulatory Visit
Admission: RE | Admit: 2020-10-14 | Discharge: 2020-10-14 | Disposition: A | Discharge status: Home / Self Care | Payer: BC Managed Care – PPO | Source: Ambulatory Visit | Attending: General Surgery | Admitting: General Surgery

## 2020-10-14 DIAGNOSIS — C50411 Malignant neoplasm of upper-outer quadrant of right female breast: Secondary | ICD-10-CM

## 2020-10-14 DIAGNOSIS — Z170 Estrogen receptor positive status [ER+]: Secondary | ICD-10-CM

## 2020-10-14 HISTORY — PX: BREAST LUMPECTOMY: SHX2

## 2020-10-14 LAB — CBC
HCT: 41.2 % (ref 36.0–46.0)
Hemoglobin: 13.9 g/dL (ref 12.0–15.0)
MCH: 34.4 pg — ABNORMAL HIGH (ref 26.0–34.0)
MCHC: 33.7 g/dL (ref 30.0–36.0)
MCV: 102 fL — ABNORMAL HIGH (ref 80.0–100.0)
Platelets: 217 10*3/uL (ref 150–400)
RBC: 4.04 MIL/uL (ref 3.87–5.11)
RDW: 12.6 % (ref 11.5–15.5)
WBC: 4.4 10*3/uL (ref 4.0–10.5)
nRBC: 0 % (ref 0.0–0.2)

## 2020-10-14 LAB — BASIC METABOLIC PANEL
Anion gap: 10 (ref 5–15)
BUN: 14 mg/dL (ref 6–20)
CO2: 29 mmol/L (ref 22–32)
Calcium: 9.8 mg/dL (ref 8.9–10.3)
Chloride: 99 mmol/L (ref 98–111)
Creatinine, Ser: 0.78 mg/dL (ref 0.44–1.00)
GFR, Estimated: >60 mL/min (ref >60)
Glucose, Bld: 75 mg/dL (ref 70–99)
Potassium: 3.7 mmol/L (ref 3.5–5.1)
Sodium: 138 mmol/L (ref 135–145)

## 2020-10-14 NOTE — Anesthesia Preprocedure Evaluation (Addendum)
Anesthesia Evaluation  Patient identified by MRN, date of birth, ID band Patient awake    Reviewed: Allergy & Precautions, NPO status , Patient's Chart, lab work & pertinent test results  Airway Mallampati: IV  TM Distance: >3 FB Neck ROM: Full    Dental no notable dental hx.    Pulmonary former smoker,    Pulmonary exam normal breath sounds clear to auscultation       Cardiovascular hypertension, Pt. on medications Normal cardiovascular exam Rhythm:Regular Rate:Normal  ECG: rate 64. Normal sinus rhythm Left axis deviation   Neuro/Psych negative neurological ROS  negative psych ROS   GI/Hepatic negative GI ROS, Neg liver ROS,   Endo/Other  Hypothyroidism   Renal/GU negative Renal ROS     Musculoskeletal negative musculoskeletal ROS (+)   Abdominal   Peds  Hematology negative hematology ROS (+)   Anesthesia Other Findings RIGHT BREAST CANCER  Reproductive/Obstetrics                            Anesthesia Physical Anesthesia Plan  ASA: II  Anesthesia Plan: General and Regional   Post-op Pain Management: GA combined w/ Regional for post-op pain   Induction: Intravenous  PONV Risk Score and Plan: 3 and Ondansetron, Dexamethasone, Midazolam and Treatment may vary due to age or medical condition  Airway Management Planned: LMA  Additional Equipment:   Intra-op Plan:   Post-operative Plan: Extubation in OR  Informed Consent: I have reviewed the patients History and Physical, chart, labs and discussed the procedure including the risks, benefits and alternatives for the proposed anesthesia with the patient or authorized representative who has indicated his/her understanding and acceptance.     Dental advisory given  Plan Discussed with: CRNA  Anesthesia Plan Comments: (Reviewed PAT note written 10/14/2020 by Myra Gianotti, PA-C. )      Anesthesia Quick Evaluation

## 2020-10-14 NOTE — Progress Notes (Addendum)
Anesthesia Chart Review:  Case: [id number] Date/Time: 10/15/20 1215   Procedure: RIGHT BREAST LUMPECTOMY WITH RADIOACTIVE SEED AND RIGHT AXILLARY SENTINEL LYMPH NODE BIOPSY (Right Breast) - PEC BLOCK   Anesthesia type: General   Pre-op diagnosis: RIGHT BREAST CANCER   Location: [facility] ROOM 02 / [facility]   Surgeons: Rolm Bookbinder, MD      DISCUSSION: Patient is a 54 year old female scheduled for the above procedure.  History includes former smoker (quit 10/31/93), HTN, hypothyroidism, right breast cancer (09/18/20 biopsy: invasive mammary carcinoma, mammary carcinoma in situ), murmur (trivial MR/PR, mild TR 04/08/14).   Preoperative EKG and labs reviewed. She is scheduled for RSL this morning (10/14/20 at 10:45 AM). I called and spoke with patient. She is currently living with her niece after her house sold and while waiting for the purchase of a condo to be finalized. She is seeing her GYN Dr. Radene Knee for primary care. No known heart issues other that echo for murmur evaluation in 2015 which did not show any significant valvular disease. She denied chest pain, SOB, edema, palpitations, syncope. She is living on the second floor and has not difficultly climbing stairs and when the weather was not as cold, she was walking 45-60 minutes without CV symptoms. No known DM history. Non smoker since 1995. Reported BP readings have overall been stable, with HCTZ increased from 12.5 mg to 25 mg. Denied any personal or family history of anesthesia complications.   10/13/2020 presurgical COVID-19 test negative. Based on currently available information, I would anticipate that she can proceed as planned. Anesthesia team to evaluate on the day of surgery.    VS: BP (!) 135/96   Pulse 73   Temp 36.9 C (Oral)   Resp 17   Ht 5\' 2"  (1.575 m)   Wt 62.6 kg   SpO2 98%   BMI 25.24 kg/m  Postmenopausal.    PROVIDERS: Arvella Nigh, MD is listed as PCP (GYN) Magrinat, Sarajane Jews, MD is HEM-ONC Gery Pray, MD is  RAD-ONC   LABS: Labs reviewed: Acceptable for surgery. (all labs ordered are listed, but only abnormal results are displayed)  Labs Reviewed  BASIC METABOLIC PANEL  CBC     EKG: 10/13/20: Normal sinus rhythm Left axis deviation Pulmonary disease pattern Inferior infarct , age undetermined Abnormal ECG No previous tracing Confirmed by Ena Dawley [phone]) on 10/13/2020 7:55:55 PM - She may have had a prior EKG in her 20's, but no comparison tracings currently available. Unremarkable echo in 2015. Recently able to walk 45-60 minutes without CV symptoms.    CV: Echo 04/08/14 (ordered by Dr. Radene Knee for evaluation of systolic murmur): Study Conclusions  - Left ventricle: The cavity size was normal. Systolic function was  normal. The estimated ejection fraction was in the range of 55%  to 60%. Wall motion was normal; there were no regional wall  motion abnormalities.  - Atrial septum: No defect or patent foramen ovale was identified.  -Trivial mitral valve regurgitation.  Trivial pulmonic valve regurgitation.  Mild tricuspid valve regurgitation.   Past Medical History:  Diagnosis Date  . Breast cancer ([place])   . Heart murmur   . Hypertension   . Hypothyroidism     Past Surgical History:  Procedure Laterality Date  . COLONOSCOPY    . WISDOM TOOTH EXTRACTION      MEDICATIONS: . cetirizine (ZYRTEC) 10 MG tablet  . hydrochlorothiazide (HYDRODIURIL) 25 MG tablet  . ibandronate (BONIVA) 150 MG tablet  . naproxen sodium (ALEVE) 220 MG  tablet  . SYNTHROID 75 MCG tablet   No current facility-administered medications for this encounter.    Myra Gianotti, PA-C Surgical Short Stay/Anesthesiology [facility] Phone [phone] [facility] Phone [phone] 10/14/2020 9:35 AM

## 2020-10-15 ENCOUNTER — Ambulatory Visit (HOSPITAL_COMMUNITY)
Admission: RE | Admit: 2020-10-15 | Discharge: 2020-10-15 | Disposition: A | Discharge status: Home / Self Care | Payer: BC Managed Care – PPO | Attending: General Surgery | Admitting: General Surgery

## 2020-10-15 ENCOUNTER — Encounter (HOSPITAL_COMMUNITY)
Admission: RE | Admit: 2020-10-15 | Discharge: 2020-10-15 | Disposition: A | Discharge status: Home / Self Care | Payer: BC Managed Care – PPO | Source: Ambulatory Visit | Attending: General Surgery | Admitting: General Surgery

## 2020-10-15 ENCOUNTER — Encounter (HOSPITAL_COMMUNITY): Payer: Self-pay | Admitting: General Surgery

## 2020-10-15 ENCOUNTER — Encounter: Payer: Self-pay | Admitting: *Deleted

## 2020-10-15 ENCOUNTER — Ambulatory Visit
Admission: RE | Admit: 2020-10-15 | Discharge: 2020-10-15 | Disposition: A | Discharge status: Home / Self Care | Payer: BC Managed Care – PPO | Source: Ambulatory Visit | Attending: General Surgery | Admitting: General Surgery

## 2020-10-15 ENCOUNTER — Encounter (HOSPITAL_COMMUNITY)
Admission: RE | Disposition: A | Discharge status: Home / Self Care | Payer: Self-pay | Source: Home / Self Care | Attending: General Surgery

## 2020-10-15 ENCOUNTER — Ambulatory Visit (HOSPITAL_COMMUNITY): Payer: BC Managed Care – PPO | Admitting: Vascular Surgery

## 2020-10-15 ENCOUNTER — Other Ambulatory Visit: Payer: Self-pay

## 2020-10-15 ENCOUNTER — Ambulatory Visit (HOSPITAL_COMMUNITY): Payer: BC Managed Care – PPO | Admitting: Anesthesiology

## 2020-10-15 DIAGNOSIS — C50411 Malignant neoplasm of upper-outer quadrant of right female breast: Secondary | ICD-10-CM

## 2020-10-15 DIAGNOSIS — Z20822 Contact with and (suspected) exposure to covid-19: Secondary | ICD-10-CM | POA: Insufficient documentation

## 2020-10-15 HISTORY — PX: AXILLARY SENTINEL NODE BIOPSY: SHX5738

## 2020-10-15 HISTORY — PX: BREAST LUMPECTOMY WITH RADIOACTIVE SEED AND SENTINEL LYMPH NODE BIOPSY: SHX6550

## 2020-10-15 SURGERY — BREAST LUMPECTOMY WITH RADIOACTIVE SEED AND SENTINEL LYMPH NODE BIOPSY
Anesthesia: Regional | Site: Breast | Laterality: Right

## 2020-10-15 MED ORDER — OXYCODONE HCL 5 MG PO TABS
ORAL_TABLET | ORAL | Status: AC
  Filled 2020-10-15: qty 1

## 2020-10-15 MED ORDER — FENTANYL CITRATE (PF) 100 MCG/2ML IJ SOLN
INTRAMUSCULAR | Status: AC
  Filled 2020-10-15: qty 2

## 2020-10-15 MED ORDER — OXYCODONE HCL 5 MG PO TABS
5.0000 mg | ORAL_TABLET | Freq: Once | ORAL | Status: AC | PRN
  Administered 2020-10-15: 5 mg via ORAL

## 2020-10-15 MED ORDER — FENTANYL CITRATE (PF) 100 MCG/2ML IJ SOLN
25.0000 ug | INTRAMUSCULAR | Status: DC | PRN
  Administered 2020-10-15 (×2): 25 ug via INTRAVENOUS

## 2020-10-15 MED ORDER — ALBUMIN HUMAN 5 % IV SOLN: INTRAVENOUS | Status: DC | PRN

## 2020-10-15 MED ORDER — TRAMADOL HCL 50 MG PO TABS: 50.0000 mg | ORAL_TABLET | Freq: Four times a day (QID) | ORAL | 0 refills | Status: DC | PRN

## 2020-10-15 MED ORDER — ENSURE PRE-SURGERY PO LIQD: 296.0000 mL | Freq: Once | ORAL | Status: DC

## 2020-10-15 MED ORDER — ACETAMINOPHEN 500 MG PO TABS
1000.0000 mg | ORAL_TABLET | ORAL | Status: AC
  Administered 2020-10-15: 1000 mg via ORAL
  Filled 2020-10-15: qty 2

## 2020-10-15 MED ORDER — KETOROLAC TROMETHAMINE 15 MG/ML IJ SOLN
15.0000 mg | INTRAMUSCULAR | Status: AC
  Administered 2020-10-15: 15 mg via INTRAVENOUS
  Filled 2020-10-15: qty 1

## 2020-10-15 MED ORDER — LIDOCAINE 2% (20 MG/ML) 5 ML SYRINGE: INTRAMUSCULAR | Status: DC | PRN

## 2020-10-15 MED ORDER — BUPIVACAINE HCL (PF) 0.25 % IJ SOLN
INTRAMUSCULAR | Status: DC | PRN
  Administered 2020-10-15: 3 mL

## 2020-10-15 MED ORDER — PROMETHAZINE HCL 25 MG/ML IJ SOLN: 6.2500 mg | INTRAMUSCULAR | Status: DC | PRN

## 2020-10-15 MED ORDER — LACTATED RINGERS IV SOLN: INTRAVENOUS | Status: DC

## 2020-10-15 MED ORDER — SODIUM CHLORIDE (PF) 0.9 % IJ SOLN
INTRAMUSCULAR | Status: AC
  Filled 2020-10-15: qty 10

## 2020-10-15 MED ORDER — MIDAZOLAM HCL 2 MG/2ML IJ SOLN
INTRAMUSCULAR | Status: AC
  Administered 2020-10-15: 2 mg via INTRAVENOUS
  Filled 2020-10-15: qty 2

## 2020-10-15 MED ORDER — PHENYLEPHRINE 40 MCG/ML (10ML) SYRINGE FOR IV PUSH (FOR BLOOD PRESSURE SUPPORT)
PREFILLED_SYRINGE | INTRAVENOUS | Status: AC
  Filled 2020-10-15: qty 20

## 2020-10-15 MED ORDER — PROPOFOL 10 MG/ML IV BOLUS
INTRAVENOUS | Status: DC | PRN
  Administered 2020-10-15 (×2): 200 mg via INTRAVENOUS

## 2020-10-15 MED ORDER — PROPOFOL 10 MG/ML IV BOLUS
INTRAVENOUS | Status: AC
  Filled 2020-10-15: qty 20

## 2020-10-15 MED ORDER — MIDAZOLAM HCL 2 MG/2ML IJ SOLN: 2.0000 mg | Freq: Once | INTRAMUSCULAR | Status: AC

## 2020-10-15 MED ORDER — FENTANYL CITRATE (PF) 250 MCG/5ML IJ SOLN
INTRAMUSCULAR | Status: DC | PRN
  Administered 2020-10-15: 50 ug via INTRAVENOUS

## 2020-10-15 MED ORDER — BUPIVACAINE HCL (PF) 0.25 % IJ SOLN
INTRAMUSCULAR | Status: AC
  Filled 2020-10-15: qty 30

## 2020-10-15 MED ORDER — ONDANSETRON HCL 4 MG/2ML IJ SOLN
INTRAMUSCULAR | Status: DC | PRN
  Administered 2020-10-15: 4 mg via INTRAVENOUS

## 2020-10-15 MED ORDER — DEXAMETHASONE SODIUM PHOSPHATE 10 MG/ML IJ SOLN
INTRAMUSCULAR | Status: AC
  Filled 2020-10-15: qty 1

## 2020-10-15 MED ORDER — ORAL CARE MOUTH RINSE: 15.0000 mL | Freq: Once | OROMUCOSAL | Status: AC

## 2020-10-15 MED ORDER — DEXAMETHASONE SODIUM PHOSPHATE 10 MG/ML IJ SOLN
INTRAMUSCULAR | Status: DC | PRN
  Administered 2020-10-15: 10 mg via INTRAVENOUS

## 2020-10-15 MED ORDER — 0.9 % SODIUM CHLORIDE (POUR BTL) OPTIME
TOPICAL | Status: DC | PRN
  Administered 2020-10-15: 1000 mL

## 2020-10-15 MED ORDER — MIDAZOLAM HCL 2 MG/2ML IJ SOLN
INTRAMUSCULAR | Status: AC
  Filled 2020-10-15: qty 2

## 2020-10-15 MED ORDER — CEFAZOLIN SODIUM-DEXTROSE 2-4 GM/100ML-% IV SOLN
2.0000 g | INTRAVENOUS | Status: AC
  Administered 2020-10-15: 2 g via INTRAVENOUS
  Filled 2020-10-15: qty 100

## 2020-10-15 MED ORDER — OXYCODONE HCL 5 MG/5ML PO SOLN: 5.0000 mg | Freq: Once | ORAL | Status: AC | PRN

## 2020-10-15 MED ORDER — CHLORHEXIDINE GLUCONATE 0.12 % MT SOLN
15.0000 mL | Freq: Once | OROMUCOSAL | Status: AC
  Administered 2020-10-15: 15 mL via OROMUCOSAL
  Filled 2020-10-15: qty 15

## 2020-10-15 MED ORDER — ROPIVACAINE HCL 7.5 MG/ML IJ SOLN
INTRAMUSCULAR | Status: DC | PRN
  Administered 2020-10-15: 20 mL via PERINEURAL

## 2020-10-15 MED ORDER — METHYLENE BLUE 0.5 % INJ SOLN
INTRAVENOUS | Status: AC
  Filled 2020-10-15: qty 10

## 2020-10-15 MED ORDER — ONDANSETRON HCL 4 MG/2ML IJ SOLN
INTRAMUSCULAR | Status: AC
  Filled 2020-10-15: qty 2

## 2020-10-15 MED ORDER — TECHNETIUM TC 99M TILMANOCEPT KIT
1.0000 | PACK | Freq: Once | INTRAVENOUS | Status: AC | PRN
  Administered 2020-10-15: 1 via INTRADERMAL

## 2020-10-15 MED ORDER — EPHEDRINE SULFATE-NACL 50-0.9 MG/10ML-% IV SOSY
PREFILLED_SYRINGE | INTRAVENOUS | Status: DC | PRN
  Administered 2020-10-15: 10 mg via INTRAVENOUS

## 2020-10-15 MED ORDER — PHENYLEPHRINE 40 MCG/ML (10ML) SYRINGE FOR IV PUSH (FOR BLOOD PRESSURE SUPPORT)
PREFILLED_SYRINGE | INTRAVENOUS | Status: DC | PRN
  Administered 2020-10-15: 80 ug via INTRAVENOUS
  Administered 2020-10-15 (×4): 120 ug via INTRAVENOUS

## 2020-10-15 MED ORDER — PHENYLEPHRINE 40 MCG/ML (10ML) SYRINGE FOR IV PUSH (FOR BLOOD PRESSURE SUPPORT)
PREFILLED_SYRINGE | INTRAVENOUS | Status: AC
  Filled 2020-10-15: qty 10

## 2020-10-15 MED ORDER — FENTANYL CITRATE (PF) 250 MCG/5ML IJ SOLN
INTRAMUSCULAR | Status: AC
  Filled 2020-10-15: qty 5

## 2020-10-15 MED ORDER — HEMOSTATIC AGENTS (NO CHARGE) OPTIME
TOPICAL | Status: DC | PRN
  Administered 2020-10-15: 1 via TOPICAL

## 2020-10-15 SURGICAL SUPPLY — 51 items
ADH SKN CLS APL DERMABOND .7 (GAUZE/BANDAGES/DRESSINGS) ×1
APL PRP STRL LF DISP 70% ISPRP (MISCELLANEOUS) ×2
APPLIER CLIP 9.375 MED OPEN (MISCELLANEOUS)
APR CLP MED 9.3 20 MLT OPN (MISCELLANEOUS)
BINDER BREAST LRG (GAUZE/BANDAGES/DRESSINGS) ×3 IMPLANT
BINDER BREAST XLRG (GAUZE/BANDAGES/DRESSINGS) IMPLANT
CANISTER SUCT 3000ML PPV (MISCELLANEOUS) ×3 IMPLANT
CHLORAPREP W/TINT 26 (MISCELLANEOUS) ×3 IMPLANT
CLIP APPLIE 9.375 MED OPEN (MISCELLANEOUS) IMPLANT
CLIP VESOCCLUDE MED 6/CT (CLIP) ×3 IMPLANT
CLSR STERI-STRIP ANTIMIC 1/2X4 (GAUZE/BANDAGES/DRESSINGS) ×3 IMPLANT
COVER PROBE W GEL 5X96 (DRAPES) ×3 IMPLANT
COVER SURGICAL LIGHT HANDLE (MISCELLANEOUS) ×3 IMPLANT
COVER WAND RF STERILE (DRAPES) ×3 IMPLANT
DERMABOND ADVANCED (GAUZE/BANDAGES/DRESSINGS) ×1
DERMABOND ADVANCED .7 DNX12 (GAUZE/BANDAGES/DRESSINGS) ×2 IMPLANT
DEVICE DUBIN SPECIMEN MAMMOGRA (MISCELLANEOUS) ×3 IMPLANT
DRAPE CHEST BREAST 15X10 FENES (DRAPES) ×3 IMPLANT
DRSG PAD ABDOMINAL 8X10 ST (GAUZE/BANDAGES/DRESSINGS) ×6 IMPLANT
ELECT COATED BLADE 2.86 ST (ELECTRODE) ×3 IMPLANT
ELECT REM PT RETURN 9FT ADLT (ELECTROSURGICAL) ×3
ELECTRODE REM PT RTRN 9FT ADLT (ELECTROSURGICAL) ×2 IMPLANT
GAUZE SPONGE 4X4 12PLY STRL (GAUZE/BANDAGES/DRESSINGS) ×3 IMPLANT
GLOVE BIO SURGEON STRL SZ7 (GLOVE) ×3 IMPLANT
GLOVE BIOGEL PI IND STRL 7.5 (GLOVE) ×2 IMPLANT
GLOVE BIOGEL PI INDICATOR 7.5 (GLOVE) ×1
GOWN STRL REUS W/ TWL LRG LVL3 (GOWN DISPOSABLE) ×4 IMPLANT
GOWN STRL REUS W/TWL LRG LVL3 (GOWN DISPOSABLE) ×6
HEMOSTAT ARISTA ABSORB 3G PWDR (HEMOSTASIS) ×3 IMPLANT
ILLUMINATOR WAVEGUIDE N/F (MISCELLANEOUS) IMPLANT
KIT BASIN OR (CUSTOM PROCEDURE TRAY) ×3 IMPLANT
KIT MARKER MARGIN INK (KITS) ×3 IMPLANT
NEEDLE 18GX1X1/2 (RX/OR ONLY) (NEEDLE) IMPLANT
NEEDLE FILTER BLUNT 18X 1/2SAF (NEEDLE)
NEEDLE FILTER BLUNT 18X1 1/2 (NEEDLE) IMPLANT
NEEDLE HYPO 25GX1X1/2 BEV (NEEDLE) ×3 IMPLANT
NS IRRIG 1000ML POUR BTL (IV SOLUTION) ×3 IMPLANT
PACK GENERAL/GYN (CUSTOM PROCEDURE TRAY) ×3 IMPLANT
RETRACTOR ONETRAX LX 90X20 (MISCELLANEOUS) ×3 IMPLANT
STRIP CLOSURE SKIN 1/2X4 (GAUZE/BANDAGES/DRESSINGS) IMPLANT
SUT MNCRL AB 4-0 PS2 18 (SUTURE) ×6 IMPLANT
SUT MON AB 5-0 PS2 18 (SUTURE) IMPLANT
SUT SILK 2 0 SH (SUTURE) IMPLANT
SUT VIC AB 2-0 SH 27 (SUTURE) ×9
SUT VIC AB 2-0 SH 27X BRD (SUTURE) ×2 IMPLANT
SUT VIC AB 2-0 SH 27XBRD (SUTURE) ×4 IMPLANT
SUT VIC AB 3-0 SH 27 (SUTURE) ×6
SUT VIC AB 3-0 SH 27X BRD (SUTURE) ×4 IMPLANT
SYR CONTROL 10ML LL (SYRINGE) ×3 IMPLANT
TOWEL GREEN STERILE (TOWEL DISPOSABLE) ×3 IMPLANT
TOWEL GREEN STERILE FF (TOWEL DISPOSABLE) ×3 IMPLANT

## 2020-10-15 NOTE — Op Note (Signed)
Preoperative diagnosis Right breast cancer Postoperative diagnosis: Same as above Procedure: 1.  Right breast radioactive seed bracketed lumpectomy 2.  Right deep axillary sentinel lymph node biopsy Surgeon: Dr. Serita Grammes Anesthesia: General with pectoral block Estimated blood loss: 25 cc Specimens: 1.  Right breast tissue containing a clip and one seed marked with paint 2.  Right axillary sentinel nodes with highest count of 741 Complications: None Drains: None Sponge and count was correct completion Disposition recovery stable condition  Indications:  6 yof who presents with no prior breast history, no mass or dc who had screening mm that showed a small right breast mass. this was 3 mm on u/s. Nodes were negative on [place] in axilla. 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 we elected to proceed with lumpectomy and sn biopsy.   Procedure: After informed consent was obtained the patient first underwent a pectoral block.  She was injected with Lymphoseek in the standard periareolar fashion.  She was then given antibiotics.  SCDs were in place.  She was placed under general anesthesia without complication.  She was prepped and draped in the standard sterile surgical fashion.  A surgical timeout was then performed.  I located the seed in the posterior breast laterally. I then made a low axillary incision after infiltrating with marcaine. I first then entered into the axilla.  I carried this through the axillary fascia.  I used the neoprobe to identify what appeared to be several small sentinel nodes.  These were removed.  The background radioactivity was less than 10.  I then obtained hemostasis in the axilla.  The axillary fascia was closed with 2-0 Vicryl.    I then used the lighted retractor to tunnel to the seed in the uoq.  I then removed the seed and the surrounding tissue with an attempt to get a clear margin.  The posterior  margin is the muscle now. I confirmed removal of the seed and clip with a mammogram.  I then obtained hemostasis and placed clips in the cavity. I then closed the breast tissue with 2-0 vicryl The skin was closed with 3-0 Vicryl and 4-0 Monocryl.  Glue and Steri-Strips were applied.  She tolerated this well was extubated and transferred to recovery stable.

## 2020-10-15 NOTE — H&P (Signed)
80 yof who presents with no prior breast history, no mass or dc who had screening mm that showed a small right breast mass. this was 3 mm on u/s. Nodes were negative on [place] in axilla. 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 she is otherwise healthy. she is here with her sister today to discuss options.   Past Surgical History Conni Slipper, RN; 10/07/2020 8:21 AM) Colon Polyp Removal - Colonoscopy  Oral Surgery   Diagnostic Studies History Conni Slipper, RN; 10/07/2020 8:21 AM) Colonoscopy  1-5 years ago Mammogram  within last year Pap Smear  1-5 years ago  Medication History Conni Slipper, RN; 10/07/2020 8:21 AM) Medications Reconciled  Social History Conni Slipper, RN; 10/07/2020 8:21 AM) Alcohol use  Occasional alcohol use. Caffeine use  Coffee, Tea. Tobacco use  Former smoker.  Family History Conni Slipper, RN; 10/07/2020 8:21 AM) Arthritis  Mother. Respiratory Condition  Father. Thyroid problems  Family Members In General, Mother.  Pregnancy / Birth History Conni Slipper, RN; 10/07/2020 8:21 AM) Age at menarche  24 years. Age of menopause  35-50 Contraceptive History  Oral contraceptives. Gravida  0 Para  0  Other Problems Conni Slipper, RN; 10/07/2020 8:21 AM) Heart murmur  High blood pressure  Thyroid Disease     Review of Systems Conni Slipper RN; 10/07/2020 8:21 AM) General Not Present- Appetite Loss, Chills, Fatigue, Fever, Night Sweats, Weight Gain and Weight Loss. Skin Not Present- Change in Wart/Mole, Dryness, Hives, Jaundice, New Lesions, Non-Healing Wounds, Rash and Ulcer. HEENT Present- Wears glasses/contact lenses. Not Present- Earache, Hearing Loss, Hoarseness, Nose Bleed, Oral Ulcers, Ringing in the Ears, Seasonal Allergies, Sinus Pain, Sore Throat, Visual Disturbances and Yellow Eyes. Respiratory Not Present- Bloody sputum, Chronic Cough, Difficulty Breathing, Snoring and Wheezing. Breast  Not Present- Breast Mass, Breast Pain, Nipple Discharge and Skin Changes. Cardiovascular Not Present- Chest Pain, Difficulty Breathing Lying Down, Leg Cramps, Palpitations, Rapid Heart Rate, Shortness of Breath and Swelling of Extremities. Gastrointestinal Not Present- Abdominal Pain, Bloating, Bloody Stool, Change in Bowel Habits, Chronic diarrhea, Constipation, Difficulty Swallowing, Excessive gas, Gets full quickly at meals, Hemorrhoids, Indigestion, Nausea, Rectal Pain and Vomiting. Female Genitourinary Not Present- Frequency, Nocturia, Painful Urination, Pelvic Pain and Urgency. Musculoskeletal Not Present- Back Pain, Joint Pain, Joint Stiffness, Muscle Pain, Muscle Weakness and Swelling of Extremities. Neurological Not Present- Decreased Memory, Fainting, Headaches, Numbness, Seizures, Tingling, Tremor, Trouble walking and Weakness. Psychiatric Not Present- Anxiety, Bipolar, Change in Sleep Pattern, Depression, Fearful and Frequent crying. Endocrine Not Present- Cold Intolerance, Excessive Hunger, Hair Changes, Heat Intolerance, Hot flashes and New Diabetes. Hematology Not Present- Blood Thinners, Easy Bruising, Excessive bleeding, Gland problems, HIV and Persistent Infections.   Physical Exam Rolm Bookbinder MD; 10/07/2020 4:33 PM) General Mental Status-Alert. Orientation-Oriented X3.  Breast Nipples-No Discharge. Breast Lump-No Palpable Breast Mass.  Lymphatic Head & Neck  General Head & Neck Lymphatics: Bilateral - Description - Normal. Axillary  General Axillary Region: Bilateral - Description - Normal. Note: no [place] adenopathy     Assessment & Plan Rolm Bookbinder MD; 10/07/2020 4:38 PM) BREAST CANCER OF UPPER-OUTER QUADRANT OF RIGHT FEMALE BREAST (C50.411) Story: Right breast seed guided lumpectomy, right axillary sentinel node biopsy We discussed the staging and pathophysiology of breast cancer. We discussed all of the different options for treatment for  breast cancer including surgery, chemotherapy, radiation therapy, Herceptin, and antiestrogen therapy. We discussed a sentinel lymph node biopsy as she does not  appear to having lymph node involvement right now. We discussed the performance of that with injection of radioactive tracer. We discussed up to a 5% risk lifetime of chronic shoulder pain as well as lymphedema associated with a sentinel lymph node biopsy. We discussed the options for treatment of the breast cancer which included lumpectomy versus a mastectomy. We discussed the performance of the lumpectomy with radioactive seed placement. We discussed a 5-10% chance of a positive margin requiring reexcision in the operating room. We also discussed that she will need radiation therapy if she undergoes lumpectomy. We discussed mastectomy and the postoperative care for that as well. Mastectomy can be followed by reconstruction. The decision for lumpectomy vs mastectomy has no impact on decision for chemotherapy. Most mastectomy patients will not need radiation therapy. We discussed that there is no difference in her survival whether she undergoes lumpectomy with radiation therapy or antiestrogen therapy versus a mastectomy. There is also no real difference between her recurrence in the breast. We discussed the risks of operation including bleeding, infection, possible reoperation. She understands her further therapy will be based on what her stages at the time of her operation.

## 2020-10-15 NOTE — Discharge Instructions (Signed)
[facility] Office Phone Number [phone]  BREAST BIOPSY/ PARTIAL MASTECTOMY: POST OP INSTRUCTIONS Take 400 mg of ibuprofen every 8 hours or 650 mg tylenol every 6 hours for next 72 hours then as needed. Use ice several times daily also. Always review your discharge instruction sheet given to you by the facility where your surgery was performed.  IF YOU HAVE DISABILITY OR FAMILY LEAVE FORMS, YOU MUST BRING THEM TO THE OFFICE FOR PROCESSING.  DO NOT GIVE THEM TO YOUR DOCTOR.  1. A prescription for pain medication may be given to you upon discharge.  Take your pain medication as prescribed, if needed.  If narcotic pain medicine is not needed, then you may take acetaminophen (Tylenol), naprosyn (Alleve) or ibuprofen (Advil) as needed. 2. Take your usually prescribed medications unless otherwise directed 3. If you need a refill on your pain medication, please contact your pharmacy.  They will contact our office to request authorization.  Prescriptions will not be filled after 5pm or on week-ends. 4. You should eat very light the first 24 hours after surgery, such as soup, crackers, pudding, etc.  Resume your normal diet the day after surgery. 5. Most patients will experience some swelling and bruising in the breast.  Ice packs and a good support bra will help.  Wear the breast binder provided or a sports bra for 72 hours day and night.  After that wear a sports bra during the day until you return to the office. Swelling and bruising can take several days to resolve.  6. It is common to experience some constipation if taking pain medication after surgery.  Increasing fluid intake and taking a stool softener will usually help or prevent this problem from occurring.  A mild laxative (Milk of Magnesia or Miralax) should be taken according to package directions if there are no bowel movements after 48 hours. 7. Unless discharge instructions indicate otherwise, you may remove your bandages 48  hours after surgery and you may shower at that time.  You may have steri-strips (small skin tapes) in place directly over the incision.  These strips should be left on the skin for 7-10 days and will come off on their own.  If your surgeon used skin glue on the incision, you may shower in 24 hours.  The glue will flake off over the next 2-3 weeks.  Any sutures or staples will be removed at the office during your follow-up visit. 8. ACTIVITIES:  You may resume regular daily activities (gradually increasing) beginning the next day.  Wearing a good support bra or sports bra minimizes pain and swelling.  You may have sexual intercourse when it is comfortable. a. You may drive when you no longer are taking prescription pain medication, you can comfortably wear a seatbelt, and you can safely maneuver your car and apply brakes. b. RETURN TO WORK:  ______________________________________________________________________________________ 9. You should see your doctor in the office for a follow-up appointment approximately two weeks after your surgery.  Your doctor's nurse will typically make your follow-up appointment when she calls you with your pathology report.  Expect your pathology report 3-4 business days after your surgery.  You may call to check if you do not hear from us after three days. 10. OTHER INSTRUCTIONS: _______________________________________________________________________________________________ _____________________________________________________________________________________________________________________________________ _____________________________________________________________________________________________________________________________________ _____________________________________________________________________________________________________________________________________  WHEN TO CALL DR Meggin Ola: 1. Fever over 101.0 2. Nausea and/or vomiting. 3. Extreme swelling or  bruising. 4. Continued bleeding from incision. 5. Increased pain, redness, or drainage from the incision.  The clinic   staff is available to answer your questions during regular business hours.  Please don't hesitate to call and ask to speak to one of the nurses for clinical concerns.  If you have a medical emergency, go to the nearest emergency room or call 911.  A surgeon from [facility] is always on call at the hospital.  For further questions, please visit [email or web address]  

## 2020-10-15 NOTE — Anesthesia Postprocedure Evaluation (Signed)
Anesthesia Post Note  Patient: Ariel Bell  Procedure(s) Performed: RIGHT BREAST LUMPECTOMY WITH RADIOACTIVE SEED (Right Breast) RIGHT AXILLARY SENTINEL NODE BIOPSY (Right Axilla)     Patient location during evaluation: PACU Anesthesia Type: General Level of consciousness: sedated Pain management: pain level controlled Vital Signs Assessment: post-procedure vital signs reviewed and stable Respiratory status: spontaneous breathing and respiratory function stable Cardiovascular status: stable Postop Assessment: no apparent nausea or vomiting Anesthetic complications: no   No complications documented.  Last Vitals:  Vitals:   10/15/20 1508 10/15/20 1517  BP: 140/80 128/76  Pulse: (!) 57 (!) 58  Resp:  16  Temp: 36.4 C   SpO2: 100% 100%    Last Pain:  Vitals:   10/15/20 1517  TempSrc:   PainSc: 2                  Charnell Peplinski DANIEL

## 2020-10-15 NOTE — Anesthesia Procedure Notes (Addendum)
Procedure Name: LMA Insertion Date/Time: 10/15/2020 1:10 PM Performed by: Murvin Natal, MD Pre-anesthesia Checklist: Patient identified, Emergency Drugs available, Suction available and Patient being monitored Patient Re-evaluated:Patient Re-evaluated prior to induction Oxygen Delivery Method: Circle System Utilized Preoxygenation: Pre-oxygenation with 100% oxygen Induction Type: IV induction Ventilation: Mask ventilation without difficulty LMA: LMA inserted LMA Size: 4.0 Number of attempts: 1 Airway Equipment and Method: Bite block Placement Confirmation: positive ETCO2 Tube secured with: Tape Dental Injury: Teeth and Oropharynx as per pre-operative assessment  Comments: Audible leak at 15 with LMA #3, replaced with LMA #4 by MDA Ellender.

## 2020-10-15 NOTE — Interval H&P Note (Signed)
History and Physical Interval Note:  10/15/2020 10:47 AM  Ariel Bell  has presented today for surgery, with the diagnosis of RIGHT BREAST CANCER.  The various methods of treatment have been discussed with the patient and family. After consideration of risks, benefits and other options for treatment, the patient has consented to  Procedure(s) with comments: RIGHT BREAST LUMPECTOMY WITH RADIOACTIVE SEED AND RIGHT AXILLARY SENTINEL LYMPH NODE BIOPSY (Right) - PEC BLOCK as a surgical intervention.  The patient's history has been reviewed, patient examined, no change in status, stable for surgery.  I have reviewed the patient's chart and labs.  Questions were answered to the patient's satisfaction.     Rolm Bookbinder

## 2020-10-15 NOTE — Anesthesia Procedure Notes (Signed)
Anesthesia Regional Block: Pectoralis block   Pre-Anesthetic Checklist: ,, timeout performed, Correct Patient, Correct Site, Correct Laterality, Correct Procedure, Correct Position, site marked, Risks and benefits discussed,  Surgical consent,  Pre-op evaluation,  At surgeon's request and post-op pain management  Laterality: Right  Prep: chloraprep       Needles:  Injection technique: Single-shot  Needle Type: Echogenic Stimulator Needle     Needle Length: 9cm  Needle Gauge: 21     Additional Needles:   Procedures:,,,, ultrasound used (permanent image in chart),,,,  Narrative:  Start time: 10/15/2020 12:05 PM End time: 10/15/2020 12:15 PM Injection made incrementally with aspirations every 5 mL.  Performed by: Personally  Anesthesiologist: Murvin Natal, MD  Additional Notes: Functioning IV was confirmed and monitors were applied.  A timeout was performed. Sterile prep, hand hygiene and sterile gloves were used. A 107mm 21ga Arrow echogenic stimulator needle was used. Negative aspiration and negative test dose prior to incremental administration of local anesthetic. The patient tolerated the procedure well.  Ultrasound guidance: relevent anatomy identified, needle position confirmed, local anesthetic spread visualized around nerve(s), vascular puncture avoided.  Image printed for medical record.

## 2020-10-15 NOTE — Interval H&P Note (Signed)
History and Physical Interval Note:  10/15/2020 12:11 PM  Ariel Bell  has presented today for surgery, with the diagnosis of RIGHT BREAST CANCER.  The various methods of treatment have been discussed with the patient and family. After consideration of risks, benefits and other options for treatment, the patient has consented to  Procedure(s) with comments: RIGHT BREAST LUMPECTOMY WITH RADIOACTIVE SEED AND RIGHT AXILLARY SENTINEL LYMPH NODE BIOPSY (Right) - PEC BLOCK as a surgical intervention.  The patient's history has been reviewed, patient examined, no change in status, stable for surgery.  I have reviewed the patient's chart and labs.  Questions were answered to the patient's satisfaction.     Rolm Bookbinder

## 2020-10-15 NOTE — Transfer of Care (Signed)
Immediate Anesthesia Transfer of Care Note  Patient: Ariel Bell  Procedure(s) Performed: RIGHT BREAST LUMPECTOMY WITH RADIOACTIVE SEED (Right Breast) RIGHT AXILLARY SENTINEL NODE BIOPSY (Right Axilla)  Patient Location: PACU  Anesthesia Type:GA combined with regional for post-op pain  Level of Consciousness: awake, alert , patient cooperative and responds to stimulation  Airway & Oxygen Therapy: Patient Spontanous Breathing and Patient connected to face mask oxygen  Post-op Assessment: Report given to RN and Post -op Vital signs reviewed and stable  Post vital signs: Reviewed and stable  Last Vitals:  Vitals Value Taken Time  BP 124/77 10/15/20 1423  Temp    Pulse 70   Resp 29 10/15/20 1424  SpO2 100   Vitals shown include unvalidated device data.  Last Pain:  Vitals:   10/15/20 1112  TempSrc:   PainSc: 0-No pain      Patients Stated Pain Goal: 2 (09/73/53 2992)  Complications: No complications documented.

## 2020-10-16 ENCOUNTER — Encounter (HOSPITAL_COMMUNITY): Payer: Self-pay | Admitting: General Surgery

## 2020-10-19 ENCOUNTER — Telehealth: Payer: Self-pay | Admitting: *Deleted

## 2020-10-19 ENCOUNTER — Encounter: Payer: Self-pay | Admitting: *Deleted

## 2020-10-19 LAB — SURGICAL PATHOLOGY

## 2020-10-19 NOTE — Telephone Encounter (Signed)
Left vm to assess needs as well as request information for FMLA needs. Contact information provided.

## 2020-11-05 NOTE — Progress Notes (Signed)
Location of Breast Cancer:Malignant neoplasm of upper-outer quadrant of right breast in female, estrogen receptor positive ([place]). DCSI  Histology per Pathology Report:  09/28/2020   10/15/2020    Receptor Status: ER(+), PR (+), Her2-neu (-), Ki67 (10%)  Did patient present with symptoms (if so, please note symptoms) or was this found on screening mammography?: Screening Mammogram  Past/Anticipated interventions by surgeon, if FPO:IPPGFQ LUMPECTOMY WITH RADIOACTIVE SEED AND SENTINEL LYMPH NODE BIOPSY 10/15/2020  Past/Anticipated interventions by medical oncology, if any: Chemotherapy not appropriate  Lymphedema issues, if any:  denies having any swelling in the arm  Pain issues, if any:  no  SAFETY ISSUES: Prior radiation? no Pacemaker/ICD? no Possible current pregnancy?no Is the patient on methotrexate? no  Current Complaints / other details:  Patient lives with niece but will be moving into a condo this weekend.

## 2020-11-11 NOTE — Progress Notes (Signed)
Radiation Oncology         [phone] ________________________________  Name: Ariel Bell MRN: [id number]  Date: 11/12/2020  DOB: [birth year]  Re-Evaluation Note  CC: Arvella Nigh, MD  Magrinat, Virgie Dad, MD    ICD-10-CM   1. Malignant neoplasm of upper-outer quadrant of right breast in female, estrogen receptor positive ([place])  C50.411    Z17.0     Diagnosis:  Stage T1A, N0, Mx, Right Breast UOQ, Invasive Ductal Carcinoma with DCIS, ER+ / PR+ / Her2-, Grade 2  Narrative:  The patient returns today to discuss radiation treatment options. She was seen in the multidisciplinary breast clinic on 10/07/2020, at which time it was recommended that she proceed with right lumpectomy with sentinel lymph node biopsy, adjuvant radiation therapy, and aromatase inhibitor.  She underwent a right breast lumpectomy with right deep axillary sentinel lymph node biopsy on 10/15/2020 under the care of Dr. Donne Hazel. Pathology from the procedure revealed benign breast tissue with previous biopsy site changes and no residual carcinoma of the right breast lumpectomy. Right axillary sentinel lymph node excision showed benign fibrovascular and adipose tissue without nodal tissue. No carcinoma was identified in one lymph node.  On review of systems, the patient reports feeling well. She denies pain within the right breast or arm swelling and any other symptoms.    Allergies:  has No Known Allergies.  Meds: Current Outpatient Medications  Medication Sig Dispense Refill  . cetirizine (ZYRTEC) 10 MG tablet Take 10 mg by mouth daily as needed for allergies.    . hydrochlorothiazide (HYDRODIURIL) 25 MG tablet Take 25 mg by mouth daily.    Marland Kitchen ibandronate (BONIVA) 150 MG tablet Take 150 mg by mouth every 30 (thirty) days.    . naproxen sodium (ALEVE) 220 MG tablet Take 220 mg by mouth daily as needed (mild pain).    . SYNTHROID 75 MCG tablet Take 75 mcg by mouth daily before breakfast.    . traMADol (ULTRAM) 50 MG  tablet Take 1 tablet (50 mg total) by mouth every 6 (six) hours as needed. 10 tablet 0   No current facility-administered medications for this encounter.    Physical Findings: The patient is in no acute distress. Patient is alert and oriented.  height is _0  (1.575 m) and weight is 139 lb 6 oz (63.2 kg). Her temporal temperature is 97.7 F (36.5 C). Her blood pressure is 134/77 and her pulse is 81. Her respiration is 18 and oxygen saturation is 100%.  . Lungs are clear to auscultation bilaterally. Heart has regular rate and rhythm. No palpable cervical, supraclavicular, or axillary adenopathy. Abdomen soft, non-tender, normal bowel sounds. Left breast: no palpable mass, nipple discharge or bleeding. Right breast: Well-healing scar in the upper outer quadrant which encompasses both the lumpectomy site as well as the sentinel node procedure.  Lab Findings: Lab Results  Component Value Date   WBC 4.4 10/13/2020   HGB 13.9 10/13/2020   HCT 41.2 10/13/2020   MCV 102.0 (H) 10/13/2020   PLT 217 10/13/2020    Radiographic Findings: NM Sentinel Node Inj-No Rpt (Breast)  Result Date: 10/15/2020 Sulfur colloid was injected by the nuclear medicine technologist for melanoma sentinel node.   MM Breast Surgical Specimen  Result Date: 10/15/2020 CLINICAL DATA:  Specimen radiograph status post right breast lumpectomy. EXAM: SPECIMEN RADIOGRAPH OF THE RIGHT BREAST COMPARISON:  Previous exam(s). FINDINGS: Status post excision of the right breast. The radioactive seed and biopsy marker clip are present, completely intact, and  were marked for pathology. These findings were communicated to the OR at 1:44 p.m. IMPRESSION: Specimen radiograph of the right breast. Electronically Signed   By: Ammie Ferrier M.D.   On: 10/15/2020 13:45   MM RT RADIOACTIVE SEED LOC MAMMO GUIDE  Result Date: 10/14/2020 CLINICAL DATA:  Localization prior to surgery. EXAM: MAMMOGRAPHIC GUIDED RADIOACTIVE SEED LOCALIZATION  OF THE RIGHT BREAST COMPARISON:  Previous exam(s). FINDINGS: Patient presents for radioactive seed localization prior to surgery. I met with the patient and we discussed the procedure of seed localization including benefits and alternatives. We discussed the high likelihood of a successful procedure. We discussed the risks of the procedure including infection, bleeding, tissue injury and further surgery. We discussed the low dose of radioactivity involved in the procedure. Informed, written consent was given. The usual time-out protocol was performed immediately prior to the procedure. Using mammographic guidance, sterile technique, 1% lidocaine and an I-125 radioactive seed, the biopsy clip was localized using a lateral approach. The follow-up mammogram images confirm the seed in the expected location and were marked for the surgeon. Follow-up survey of the patient confirms presence of the radioactive seed. Order number of I-125 seed:  [id number]. Total activity:  1.580 millicuries reference Date: August 04, 2020 The patient tolerated the procedure well and was released from the [facility]. She was given instructions regarding seed removal. IMPRESSION: Radioactive seed localization right breast. No apparent complications. Electronically Signed   By: Dorise Bullion III M.D   On: 10/14/2020 12:05    Impression: Stage T1A, N0, Mx, Right Breast UOQ, Invasive Ductal Carcinoma with DCIS, ER+ / PR+ / Her2-, Grade 2  The patient would be an excellent candidate for breast conservation with radiation therapy directed at the right breast.  Her definitive surgery and sentinel node procedure showed no residual carcinoma and no spread into the axillary area.  Her initial biopsy removed all of the tumor.  Given the patient's age I would recommend radiation therapy as adjuvant treatment.  I discussed the overall course of treatment side effects and potential toxicities of radiation therapy directed at the right breast  with the patient.  She appears to understand and wishes to proceed with planned course of treatment.  Plan:  Patient is scheduled for CT simulation later today.  Patient will receive a  hypofractionated accelerated course of treatment encompassing 16 sessions.  No boost necessary to the lumpectomy cavity since repeat surgery revealed no residual carcinoma.  Total time spent in this encounter was 35 minutes which included reviewing the patient's most recent lumpectomy, pathology report, physical examination, and documentation.  -----------------------------------  Blair Promise, PhD, MD  This document serves as a record of services personally performed by Gery Pray, MD. It was created on his behalf by Clerance Lav, a trained medical scribe. The creation of this record is based on the scribe's personal observations and the provider's statements to them. This document has been checked and approved by the attending provider.

## 2020-11-12 ENCOUNTER — Encounter: Payer: Self-pay | Admitting: Physical Therapy

## 2020-11-12 ENCOUNTER — Ambulatory Visit
Admission: RE | Admit: 2020-11-12 | Discharge: 2020-11-12 | Disposition: A | Discharge status: Home / Self Care | Payer: BC Managed Care – PPO | Source: Ambulatory Visit | Attending: Radiation Oncology | Admitting: Radiation Oncology

## 2020-11-12 ENCOUNTER — Encounter: Payer: Self-pay | Admitting: Radiation Oncology

## 2020-11-12 ENCOUNTER — Other Ambulatory Visit: Payer: Self-pay

## 2020-11-12 ENCOUNTER — Ambulatory Visit: Payer: BC Managed Care – PPO | Attending: General Surgery | Admitting: Physical Therapy

## 2020-11-12 DIAGNOSIS — Z170 Estrogen receptor positive status [ER+]: Secondary | ICD-10-CM | POA: Diagnosis present

## 2020-11-12 DIAGNOSIS — Z79899 Other long term (current) drug therapy: Secondary | ICD-10-CM | POA: Insufficient documentation

## 2020-11-12 DIAGNOSIS — Z923 Personal history of irradiation: Secondary | ICD-10-CM | POA: Diagnosis not present

## 2020-11-12 DIAGNOSIS — C50411 Malignant neoplasm of upper-outer quadrant of right female breast: Secondary | ICD-10-CM

## 2020-11-12 DIAGNOSIS — Z510 Encounter for antineoplastic radiation therapy: Secondary | ICD-10-CM | POA: Insufficient documentation

## 2020-11-12 DIAGNOSIS — Z483 Aftercare following surgery for neoplasm: Secondary | ICD-10-CM | POA: Diagnosis present

## 2020-11-12 DIAGNOSIS — R293 Abnormal posture: Secondary | ICD-10-CM | POA: Diagnosis present

## 2020-11-12 NOTE — Patient Instructions (Signed)
            [facility]         [address], [place] [address]         [phone]         [name], PT, CLT   After Breast Cancer Class It is recommended you attend the ABC class to be educated on lymphedema risk reduction. This class is free of charge and lasts for 1 hour. It is a 1-time class.  You are scheduled for January 17th at 11:00 and we will send you a link.  Scar massage You can begin gentle scar massage to your right armpit scar using coconut oil. Do this for a just a few minutes each day.   Home exercise Program I encourage you to continue doing your exercises throughout radiation and begin a walking program - even starting with 10 minutes twice a day is fine!   Follow up PT: It is recommended you return every 3 months for the first 3 years following surgery to be assessed on the SOZO machine for an L-Dex score. This helps prevent clinically significant lymphedema in 95% of patients. These follow up screens are 15 minute appointments that you are not billed for. You are scheduled for 01/11/2021 at 12:15 (appointment in [place] says 11:45 but we know you're coming at 12:!5)

## 2020-11-12 NOTE — Progress Notes (Signed)
See md visit for abstract and nursing note

## 2020-11-12 NOTE — Therapy (Signed)
[place], [place], [address] Phone: [phone]   Fax:  [phone]  Physical Therapy Treatment  Patient Details  Name: Ariel Bell MRN: [id number] Date of Birth: 03/18/1966 Referring Provider (PT): Dr. Rolm Bookbinder   Encounter Date: 11/12/2020   PT End of Session - 11/12/20 1026    Visit Number 2    Number of Visits 2    PT Start Time 0953    PT Stop Time 1030    PT Time Calculation (min) 37 min    Activity Tolerance Patient tolerated treatment well    Behavior During Therapy [facility] for tasks assessed/performed           Past Medical History:  Diagnosis Date  . Breast cancer ([place])   . Heart murmur   . Hypertension   . Hypothyroidism     Past Surgical History:  Procedure Laterality Date  . AXILLARY SENTINEL NODE BIOPSY Right 10/15/2020   Procedure: RIGHT AXILLARY SENTINEL NODE BIOPSY;  Surgeon: Rolm Bookbinder, MD;  Location: [facility];  Service: General;  Laterality: Right;  . BREAST LUMPECTOMY WITH RADIOACTIVE SEED AND SENTINEL LYMPH NODE BIOPSY Right 10/15/2020   Procedure: RIGHT BREAST LUMPECTOMY WITH RADIOACTIVE SEED;  Surgeon: Rolm Bookbinder, MD;  Location: [facility];  Service: General;  Laterality: Right;  PEC BLOCK  . COLONOSCOPY    . WISDOM TOOTH EXTRACTION      There were no vitals filed for this visit.   Subjective Assessment - 11/12/20 0956    Subjective Patient underwent a right lumpectomy and sentinel node biopsy (1 negative node) on 10/15/2020. She has a radiation consult today.    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 She underwent a right lumpectomy and sentinel node biopsy (1 negative node) on 10/15/2020 It is ER/PR positive and HER2 negative with a Ki67 of 10%.    Patient Stated Goals See if my arm is ok    Currently in Pain? No/denies              [facility] PT Assessment -  11/12/20 0001      Assessment   Medical Diagnosis s/p right lumpectomy and SLNB    Referring Provider (PT) Dr. Rolm Bookbinder    Onset Date/Surgical Date 10/15/20    Hand Dominance Right    Prior Therapy Baselines      Precautions   Precautions Other (comment)    Precaution Comments recent surgery      Restrictions   Weight Bearing Restrictions No      Balance Screen   Has the patient fallen in the past 6 months No    Has the patient had a decrease in activity level because of a fear of falling?  No    Is the patient reluctant to leave their home because of a fear of falling?  No      Home Environment   Living Environment Private residence    Living Arrangements Other relatives   Niece   Available Help at Discharge Family      Prior Function   Level of Independence Independent    Vocation Full time employment    Printmaker full time; part-time at [employer] She is not exercising      Cognition   Overall Cognitive Status Within Functional Limits for tasks assessed      Observation/Other Assessments   Observations Only 1 incision  in right axillary region which appears to be healing well. No edema noted and no redness or signs of infection.      Posture/Postural Control   Posture/Postural Control Postural limitations    Postural Limitations Rounded Shoulders;Forward head      ROM / Strength   AROM / PROM / Strength AROM      AROM   AROM Assessment Site Shoulder    Right/Left Shoulder Right    Right Shoulder Extension 38 Degrees    Right Shoulder Flexion 154 Degrees    Right Shoulder ABduction 167 Degrees    Right Shoulder Internal Rotation 79 Degrees    Right Shoulder External Rotation 86 Degrees      Strength   Overall Strength Within functional limits for tasks performed             LYMPHEDEMA/ONCOLOGY QUESTIONNAIRE - 11/12/20 0001      Type   Cancer Type Right breast cancer      Surgeries   Lumpectomy Date 10/15/20     Sentinel Lymph Node Biopsy Date 10/15/20    Number Lymph Nodes Removed 1      Treatment   Active Chemotherapy Treatment No    Past Chemotherapy Treatment No    Active Radiation Treatment No    Past Radiation Treatment No    Current Hormone Treatment No    Past Hormone Therapy No      What other symptoms do you have   Are you Having Heaviness or Tightness No    Are you having Pain No    Are you having pitting edema No    Is it Hard or Difficult finding clothes that fit No    Do you have infections No    Is there Decreased scar mobility No    [place] Sign No      Lymphedema Assessments   Lymphedema Assessments Upper extremities      Right Upper Extremity Lymphedema   10 cm Proximal to Olecranon Process 25.9 cm    Olecranon Process 23.1 cm    10 cm Proximal to Ulnar Styloid Process 20.8 cm    Just Proximal to Ulnar Styloid Process 14.6 cm    Across Hand at [employer] 17.4 cm    At [place] of 2nd Digit 5.8 cm      Left Upper Extremity Lymphedema   10 cm Proximal to Olecranon Process 26.8 cm    Olecranon Process 23 cm    10 cm Proximal to Ulnar Styloid Process 20.2 cm    Just Proximal to Ulnar Styloid Process 14.7 cm    Across Hand at [employer] 17.5 cm    At [place] of 2nd Digit 6 cm           L-DEX FLOWSHEETS - 11/12/20 1000      L-DEX LYMPHEDEMA SCREENING   Measurement Type Unilateral    L-DEX MEASUREMENT EXTREMITY Upper Extremity    POSITION  Standing    DOMINANT SIDE Right    At Risk Side Right    BASELINE SCORE (UNILATERAL) -13    L-DEX SCORE (UNILATERAL) -10.8    VALUE CHANGE (UNILAT) 2.2             Quick Dash - 11/12/20 0001    Open a tight or new jar No difficulty    Do heavy household chores (wash walls, wash floors) No difficulty    Carry a shopping bag or briefcase No difficulty    Wash your back No  difficulty    Use a knife to cut food No difficulty    Recreational activities in which you take some force or impact through your arm,  shoulder, or hand (golf, hammering, tennis) No difficulty    During the past week, to what extent has your arm, shoulder or hand problem interfered with your normal social activities with family, friends, neighbors, or groups? Not at all    During the past week, to what extent has your arm, shoulder or hand problem limited your work or other regular daily activities Not at all    Arm, shoulder, or hand pain. None    Tingling (pins and needles) in your arm, shoulder, or hand Mild    Difficulty Sleeping No difficulty    DASH Score 2.27 %                          PT Education - 11/12/20 1026    Education Details aftercare; scar massage; HEP    Person(s) Educated Patient    Methods Explanation;Demonstration;Handout    Comprehension Returned demonstration;Verbalized understanding               PT Long Term Goals - 11/12/20 1047      PT LONG TERM GOAL #1   Title Patient will demonstrate she has regained full shoulder ROM and function post operatively compared to baselines.    Time 8    Period Weeks    Status Achieved                 Plan - 11/12/20 1044    Clinical Impression Statement Patient is doing very well s/p right lumpectomy and sentinel node biopsy (1 negative node) on 10/15/2020. Her incisions appears to be healing well, her shoulder ROM is restored to baseline, and she has no signs of lymphedema. L-Dex screen was repeated today as she scored outside normal limits at baseline. She continues to score outside normal parameters but is not significantly changed since her baseline. She will benefit from the ABC class for lymphedema education and from L-Dex screens every 3 months for 2 years. Otherwise she has no PT needs at this time.    PT Treatment/Interventions ADLs/Self Care Home Management;Therapeutic exercise;Patient/family education    PT Next Visit Plan D/C    PT Home Exercise Plan Post op shoulder ROM HEP    Consulted and Agree with Plan of Care  Patient           Patient will benefit from skilled therapeutic intervention in order to improve the following deficits and impairments:  Postural dysfunction,Decreased range of motion,Decreased knowledge of precautions,Impaired UE functional use,Pain,Decreased scar mobility  Visit Diagnosis: Malignant neoplasm of upper-outer quadrant of right breast in female, estrogen receptor positive ([place])  Abnormal posture  Aftercare following surgery for neoplasm     Problem List Patient Active Problem List   Diagnosis Date Noted  . Malignant neoplasm of upper-outer quadrant of right breast in female, estrogen receptor positive ([place]) 10/01/2020   PHYSICAL THERAPY DISCHARGE SUMMARY  Visits from Start of Care: 2   Current functional level related to goals / functional outcomes: Goals met. See above for objective findings.   Remaining deficits: None   Education / Equipment: HEP and lymphedema education  Plan: Patient agrees to discharge.  Patient goals were met. Patient is being discharged due to meeting the stated rehab goals.  ?????         [name], [place] 11/12/20  10:49 AM  [place], [place], [address] Phone: [phone]   Fax:  [phone]  Name: Janay Canan MRN: [id number] Date of Birth: [birth year]

## 2020-11-16 ENCOUNTER — Encounter: Payer: Self-pay | Admitting: Physical Therapy

## 2020-11-16 ENCOUNTER — Encounter: Payer: Self-pay | Admitting: *Deleted

## 2020-11-17 DIAGNOSIS — Z510 Encounter for antineoplastic radiation therapy: Secondary | ICD-10-CM | POA: Diagnosis not present

## 2020-11-19 ENCOUNTER — Ambulatory Visit
Admission: RE | Admit: 2020-11-19 | Discharge: 2020-11-19 | Disposition: A | Discharge status: Home / Self Care | Payer: BC Managed Care – PPO | Source: Ambulatory Visit | Attending: Radiation Oncology | Admitting: Radiation Oncology

## 2020-11-19 ENCOUNTER — Other Ambulatory Visit: Payer: Self-pay

## 2020-11-19 DIAGNOSIS — Z170 Estrogen receptor positive status [ER+]: Secondary | ICD-10-CM

## 2020-11-19 DIAGNOSIS — C50411 Malignant neoplasm of upper-outer quadrant of right female breast: Secondary | ICD-10-CM

## 2020-11-19 DIAGNOSIS — Z510 Encounter for antineoplastic radiation therapy: Secondary | ICD-10-CM | POA: Diagnosis not present

## 2020-11-20 ENCOUNTER — Other Ambulatory Visit: Payer: Self-pay

## 2020-11-20 ENCOUNTER — Ambulatory Visit
Admission: RE | Admit: 2020-11-20 | Discharge: 2020-11-20 | Disposition: A | Discharge status: Home / Self Care | Payer: BC Managed Care – PPO | Source: Ambulatory Visit | Attending: Radiation Oncology | Admitting: Radiation Oncology

## 2020-11-20 DIAGNOSIS — Z510 Encounter for antineoplastic radiation therapy: Secondary | ICD-10-CM | POA: Diagnosis not present

## 2020-11-23 ENCOUNTER — Ambulatory Visit
Admission: RE | Admit: 2020-11-23 | Discharge: 2020-11-23 | Disposition: A | Discharge status: Home / Self Care | Payer: BC Managed Care – PPO | Source: Ambulatory Visit | Attending: Radiation Oncology | Admitting: Radiation Oncology

## 2020-11-23 DIAGNOSIS — Z510 Encounter for antineoplastic radiation therapy: Secondary | ICD-10-CM | POA: Diagnosis not present

## 2020-11-24 ENCOUNTER — Ambulatory Visit
Admission: RE | Admit: 2020-11-24 | Discharge: 2020-11-24 | Disposition: A | Discharge status: Home / Self Care | Payer: BC Managed Care – PPO | Source: Ambulatory Visit | Attending: Radiation Oncology | Admitting: Radiation Oncology

## 2020-11-24 DIAGNOSIS — C50411 Malignant neoplasm of upper-outer quadrant of right female breast: Secondary | ICD-10-CM

## 2020-11-24 DIAGNOSIS — Z510 Encounter for antineoplastic radiation therapy: Secondary | ICD-10-CM | POA: Diagnosis not present

## 2020-11-24 MED ORDER — ALRA NON-METALLIC DEODORANT (RAD-ONC)
1.0000 "application " | Freq: Once | TOPICAL | Status: AC
  Administered 2020-11-24: 1 via TOPICAL

## 2020-11-24 MED ORDER — RADIAPLEXRX EX GEL: Freq: Once | CUTANEOUS | Status: AC

## 2020-11-25 ENCOUNTER — Ambulatory Visit: Payer: BC Managed Care – PPO

## 2020-11-25 ENCOUNTER — Telehealth: Payer: Self-pay | Admitting: *Deleted

## 2020-11-25 NOTE — Telephone Encounter (Signed)
Returned patient's phone call, lvm for a return call 

## 2020-11-26 ENCOUNTER — Ambulatory Visit
Admission: RE | Admit: 2020-11-26 | Discharge: 2020-11-26 | Disposition: A | Discharge status: Home / Self Care | Payer: BC Managed Care – PPO | Source: Ambulatory Visit | Attending: Radiation Oncology | Admitting: Radiation Oncology

## 2020-11-26 ENCOUNTER — Other Ambulatory Visit: Payer: Self-pay

## 2020-11-26 DIAGNOSIS — Z510 Encounter for antineoplastic radiation therapy: Secondary | ICD-10-CM | POA: Diagnosis not present

## 2020-11-27 ENCOUNTER — Ambulatory Visit: Payer: BC Managed Care – PPO

## 2020-11-30 ENCOUNTER — Telehealth: Payer: Self-pay | Admitting: Family Medicine

## 2020-11-30 ENCOUNTER — Ambulatory Visit
Admission: RE | Admit: 2020-11-30 | Discharge: 2020-11-30 | Disposition: A | Discharge status: Home / Self Care | Payer: BC Managed Care – PPO | Source: Ambulatory Visit | Attending: Radiation Oncology | Admitting: Radiation Oncology

## 2020-11-30 ENCOUNTER — Telehealth: Payer: Self-pay | Admitting: Oncology

## 2020-11-30 DIAGNOSIS — Z510 Encounter for antineoplastic radiation therapy: Secondary | ICD-10-CM | POA: Diagnosis not present

## 2020-11-30 NOTE — Telephone Encounter (Signed)
R/s appt per 128 sch msg - left message for patient with appt date and time

## 2020-11-30 NOTE — Telephone Encounter (Signed)
Error

## 2020-12-01 ENCOUNTER — Ambulatory Visit
Admission: RE | Admit: 2020-12-01 | Discharge: 2020-12-01 | Disposition: A | Discharge status: Home / Self Care | Payer: BC Managed Care – PPO | Source: Ambulatory Visit | Attending: Radiation Oncology | Admitting: Radiation Oncology

## 2020-12-01 ENCOUNTER — Other Ambulatory Visit: Payer: Self-pay

## 2020-12-01 DIAGNOSIS — C50411 Malignant neoplasm of upper-outer quadrant of right female breast: Secondary | ICD-10-CM | POA: Diagnosis present

## 2020-12-01 DIAGNOSIS — Z510 Encounter for antineoplastic radiation therapy: Secondary | ICD-10-CM | POA: Diagnosis not present

## 2020-12-02 ENCOUNTER — Ambulatory Visit
Admission: RE | Admit: 2020-12-02 | Discharge: 2020-12-02 | Disposition: A | Discharge status: Home / Self Care | Payer: BC Managed Care – PPO | Source: Ambulatory Visit | Attending: Radiation Oncology | Admitting: Radiation Oncology

## 2020-12-02 DIAGNOSIS — Z510 Encounter for antineoplastic radiation therapy: Secondary | ICD-10-CM | POA: Diagnosis not present

## 2020-12-03 ENCOUNTER — Ambulatory Visit
Admission: RE | Admit: 2020-12-03 | Discharge: 2020-12-03 | Disposition: A | Discharge status: Home / Self Care | Payer: BC Managed Care – PPO | Source: Ambulatory Visit | Attending: Radiation Oncology | Admitting: Radiation Oncology

## 2020-12-03 DIAGNOSIS — Z510 Encounter for antineoplastic radiation therapy: Secondary | ICD-10-CM | POA: Diagnosis not present

## 2020-12-04 ENCOUNTER — Ambulatory Visit
Admission: RE | Admit: 2020-12-04 | Discharge: 2020-12-04 | Disposition: A | Discharge status: Home / Self Care | Payer: BC Managed Care – PPO | Source: Ambulatory Visit | Attending: Radiation Oncology | Admitting: Radiation Oncology

## 2020-12-04 ENCOUNTER — Other Ambulatory Visit: Payer: Self-pay

## 2020-12-04 DIAGNOSIS — Z510 Encounter for antineoplastic radiation therapy: Secondary | ICD-10-CM | POA: Diagnosis not present

## 2020-12-07 ENCOUNTER — Other Ambulatory Visit: Payer: Self-pay

## 2020-12-07 ENCOUNTER — Ambulatory Visit
Admission: RE | Admit: 2020-12-07 | Discharge: 2020-12-07 | Disposition: A | Discharge status: Home / Self Care | Payer: BC Managed Care – PPO | Source: Ambulatory Visit | Attending: Radiation Oncology | Admitting: Radiation Oncology

## 2020-12-07 DIAGNOSIS — Z510 Encounter for antineoplastic radiation therapy: Secondary | ICD-10-CM | POA: Diagnosis not present

## 2020-12-08 ENCOUNTER — Ambulatory Visit: Payer: BC Managed Care – PPO | Admitting: Oncology

## 2020-12-08 ENCOUNTER — Ambulatory Visit
Admission: RE | Admit: 2020-12-08 | Discharge: 2020-12-08 | Disposition: A | Discharge status: Home / Self Care | Payer: BC Managed Care – PPO | Source: Ambulatory Visit | Attending: Radiation Oncology | Admitting: Radiation Oncology

## 2020-12-08 ENCOUNTER — Other Ambulatory Visit: Payer: Self-pay

## 2020-12-08 DIAGNOSIS — Z510 Encounter for antineoplastic radiation therapy: Secondary | ICD-10-CM | POA: Diagnosis not present

## 2020-12-09 ENCOUNTER — Ambulatory Visit: Payer: BC Managed Care – PPO

## 2020-12-10 ENCOUNTER — Encounter: Payer: Self-pay | Admitting: *Deleted

## 2020-12-10 ENCOUNTER — Ambulatory Visit: Payer: BC Managed Care – PPO

## 2020-12-10 ENCOUNTER — Ambulatory Visit
Admission: RE | Admit: 2020-12-10 | Discharge: 2020-12-10 | Disposition: A | Discharge status: Home / Self Care | Payer: BC Managed Care – PPO | Source: Ambulatory Visit | Attending: Radiation Oncology | Admitting: Radiation Oncology

## 2020-12-10 DIAGNOSIS — Z510 Encounter for antineoplastic radiation therapy: Secondary | ICD-10-CM | POA: Diagnosis not present

## 2020-12-11 ENCOUNTER — Ambulatory Visit
Admission: RE | Admit: 2020-12-11 | Discharge: 2020-12-11 | Disposition: A | Discharge status: Home / Self Care | Payer: BC Managed Care – PPO | Source: Ambulatory Visit | Attending: Radiation Oncology | Admitting: Radiation Oncology

## 2020-12-11 ENCOUNTER — Other Ambulatory Visit: Payer: Self-pay

## 2020-12-11 ENCOUNTER — Ambulatory Visit: Payer: BC Managed Care – PPO

## 2020-12-11 DIAGNOSIS — Z510 Encounter for antineoplastic radiation therapy: Secondary | ICD-10-CM | POA: Diagnosis not present

## 2020-12-14 ENCOUNTER — Ambulatory Visit
Admission: RE | Admit: 2020-12-14 | Discharge: 2020-12-14 | Disposition: A | Discharge status: Home / Self Care | Payer: BC Managed Care – PPO | Source: Ambulatory Visit | Attending: Radiation Oncology | Admitting: Radiation Oncology

## 2020-12-14 ENCOUNTER — Encounter: Payer: Self-pay | Admitting: *Deleted

## 2020-12-14 ENCOUNTER — Other Ambulatory Visit: Payer: Self-pay

## 2020-12-14 ENCOUNTER — Ambulatory Visit: Payer: BC Managed Care – PPO

## 2020-12-14 DIAGNOSIS — Z510 Encounter for antineoplastic radiation therapy: Secondary | ICD-10-CM | POA: Diagnosis not present

## 2020-12-14 NOTE — Progress Notes (Signed)
[facility]  [phone] Fax:([phone]) [phone]     ID: Ariel Bell DOB: [birth year]  MR#: [id number]  PQZ#:[id number]  Patient Care Team: Arvella Nigh, MD as PCP - General (Obstetrics and Gynecology) Mauro Kaufmann, RN as Oncology Nurse Navigator Rockwell Germany, RN as Oncology Nurse Navigator Gery Pray, MD as Consulting Physician (Radiation Oncology) Calla Wedekind, Virgie Dad, MD as Consulting Physician (Oncology) Rolm Bookbinder, MD as Consulting Physician (General Surgery) Ulla Gallo, MD as Consulting Physician (Dermatology) Chauncey Cruel, MD OTHER MD:  CHIEF COMPLAINT: estrogen receptor positive breast cancer  CURRENT TREATMENT:    INTERVAL HISTORY: Ariel Bell returns today for follow up of her estrogen receptor positive breast cancer. She was evaluated in the multidisciplinary breast cancer clinic on 10/07/2020.  She went on to right lumpectomy on 10/15/2020. Pathology from the procedure ([id number]) showed: no residual carcinoma; benign breast tissue with previous biopsy site changes.  [name] was also benign (0/1).  She was referred back to Dr. Sondra Come on 11/12/2020 to review radiation therapy. She subsequently began treatment on 11/19/2020. She completed treatment earlier today.   REVIEW OF SYSTEMS: Ariel Bell did well with radiation.  She had minimal fatigue.  Skin has healed up well.  There has been no peeling.  Detailed review of systems was otherwise stable   COVID 19 VACCINATION STATUS: Not vaccinated as of 12/22/2018   HISTORY OF CURRENT ILLNESS: From the original intake note:   Ariel Bell had routine screening mammography on 09/02/2020 showing a possible abnormality in the right breast. She underwent right diagnostic mammography with tomography and right breast ultrasonography at [facility] on 09/23/2020 showing: breast density category C; 5 mm mass involving upper-outer right breast with a vague  shadowing focus which is likely the sonographic correlate; no pathologic right axillary lymphadenopathy.  Accordingly on 09/28/2020 she proceeded to biopsy of the right breast area in question. The pathology from this procedure ([id number]) showed: invasive and in situ mammary carcinoma, e-cadherin positive, grade 2. Prognostic indicators significant for: estrogen receptor, 100% positive and progesterone receptor, 90% positive, both with strong staining intensity. Proliferation marker Ki67 at 10%. HER2 equivocal by immunohistochemistry (2+), but negative by fluorescent in situ hybridization with a signals ratio 1.41 and number per cell 2.05.  The patient's subsequent history is as detailed below.   PAST MEDICAL HISTORY: Past Medical History:  Diagnosis Date   Breast cancer ([place])    Heart murmur    Hypertension    Hypothyroidism     PAST SURGICAL HISTORY: Past Surgical History:  Procedure Laterality Date   AXILLARY SENTINEL NODE BIOPSY Right 10/15/2020   Procedure: RIGHT AXILLARY SENTINEL NODE BIOPSY;  Surgeon: Rolm Bookbinder, MD;  Location: [place];  Service: General;  Laterality: Right;   BREAST LUMPECTOMY WITH RADIOACTIVE SEED AND SENTINEL LYMPH NODE BIOPSY Right 10/15/2020   Procedure: RIGHT BREAST LUMPECTOMY WITH RADIOACTIVE SEED;  Surgeon: Rolm Bookbinder, MD;  Location: [place];  Service: General;  Laterality: Right;  PEC BLOCK   COLONOSCOPY     WISDOM TOOTH EXTRACTION      FAMILY HISTORY: Family History  Problem Relation Age of Onset   COPD Father    Her father died in his 85's from COPD/E. Her mother is age 74 as of 09/2020. Ariel Bell has two brothers and one sister. There is no family history of cancer to her knowledge.    GYNECOLOGIC HISTORY:  No LMP recorded. Patient is postmenopausal. Menarche: 55 years old GX P 0 LMP  age 68 HRT never used  Hysterectomy? no BSO? 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 She is divorced. She is currently staying with her niece Ariel Bell because she just sold her house, but she is planning to move out as soon as she finds her own place to live.    ADVANCED DIRECTIVES: not in place, plans to name her sister Ariel Bell as her HCPOA.   HEALTH MAINTENANCE: Social History   Tobacco Use   Smoking status: Former Smoker    Types: Cigarettes    Quit date: 1995    Years since quitting: 27.1   Smokeless tobacco: Never Used  Scientific laboratory technician Use: Never used  Substance Use Topics   Alcohol use: Yes    Alcohol/week: 14.0 standard drinks    Types: 14 Glasses of wine per week   Drug use: Never     Colonoscopy: 2018?  PAP: up to date  Bone density: osteopenia   No Known Allergies  Current Outpatient Medications  Medication Sig Dispense Refill   cetirizine (ZYRTEC) 10 MG tablet Take 10 mg by mouth daily as needed for allergies.     hydrochlorothiazide (HYDRODIURIL) 25 MG tablet Take 25 mg by mouth daily.     ibandronate (BONIVA) 150 MG tablet Take 150 mg by mouth every 30 (thirty) days.     naproxen sodium (ALEVE) 220 MG tablet Take 220 mg by mouth daily as needed (mild pain).     SYNTHROID 75 MCG tablet Take 75 mcg by mouth daily before breakfast.     traMADol (ULTRAM) 50 MG tablet Take 1 tablet (50 mg total) by mouth every 6 (six) hours as needed. 10 tablet 0   No current facility-administered medications for this visit.    OBJECTIVE: White woman who appears well  Vitals:   12/15/20 1236  BP: 129/66  Pulse: 70  Resp: 20  Temp: 98.2 F (36.8 C)  SpO2: 100%     Body mass index is 24.6 kg/m.   Wt Readings from Last 3 Encounters:  12/15/20 134 lb 8 oz (61 kg)  11/12/20 139 lb 6 oz (63.2 kg)  10/15/20 138 lb (62.6 kg)      ECOG FS:1 - Symptomatic but completely ambulatory  Sclerae unicteric, EOMs intact Wearing a mask No cervical or supraclavicular adenopathy Lungs no rales or rhonchi Heart regular rate and  rhythm Abd soft, nontender, positive bowel sounds MSK no focal spinal tenderness, no upper extremity lymphedema Neuro: nonfocal, well oriented, appropriate affect Breasts: The right breast is status Bell lumpectomy and radiation.  The cosmetic result is excellent.  There is minimal erythema over the radiation port area.  There are a couple of slight pimples in the medial and superior edges of the radiation port field.  There is no evidence of residual recurrent disease.  Both axillae are benign.   LAB RESULTS:  CMP     Component Value Date/Time   NA 138 10/13/2020 1449   K 3.7 10/13/2020 1449   CL 99 10/13/2020 1449   CO2 29 10/13/2020 1449   GLUCOSE 75 10/13/2020 1449   BUN 14 10/13/2020 1449   CREATININE 0.78 10/13/2020 1449   CREATININE 0.96 10/07/2020 1235   CALCIUM 9.8 10/13/2020 1449   PROT 7.4 10/07/2020 1235   ALBUMIN 4.2 10/07/2020 1235   AST 21 10/07/2020 1235   ALT 16 10/07/2020 1235   ALKPHOS 68 10/07/2020 1235  1.4 (H) 10/07/2020 1235  ° GFRNONAA >60 10/13/2020 1449  ° GFRNONAA >60 10/07/2020 1235  ° ° °No results found for: TOTALPROTELP, ALBUMINELP, A1GS, A2GS, BETS, BETA2SER, GAMS, MSPIKE, SPEI ° °Lab Results  °Component Value Date  ° WBC 5.1 12/15/2020  ° NEUTROABS 3.4 12/15/2020  ° HGB 13.0 12/15/2020  ° HCT 39.0 12/15/2020  ° MCV 99.2 12/15/2020  ° PLT 174 12/15/2020  ° ° °No results found for: LABCA2 ° °No components found for: LABCAN125 ° °No results for input(s): INR in the last 168 hours. ° °No results found for: LABCA2 ° °No results found for: CAN199 ° °No results found for: CAN125 ° °No results found for: CAN153 ° °No results found for: CA2729 ° °No components found for: HGQUANT ° °No results found for: CEA1 / No results found for: CEA1  ° °No results found for: AFPTUMOR ° °No results found for: CHROMOGRNA ° °No results found for: KPAFRELGTCHN, LAMBDASER, KAPLAMBRATIO °(kappa/lambda light chains) ° °No results found for: HGBA, HGBA2QUANT, HGBFQUANT,  HGBSQUAN °(Hemoglobinopathy evaluation)  ° °No results found for: LDH ° °No results found for: IRON, TIBC, IRONPCTSAT °(Iron and TIBC) ° °No results found for: FERRITIN ° °Urinalysis °No results found for: COLORURINE, APPEARANCEUR, LABSPEC, PHURINE, GLUCOSEU, HGBUR, BILIRUBINUR, KETONESUR, PROTEINUR, UROBILINOGEN, NITRITE, LEUKOCYTESUR ° ° °STUDIES: °No results found. ° ° °ELIGIBLE FOR AVAILABLE RESEARCH PROTOCOL: AET ° °ASSESSMENT: 54 y.o. [place] woman status Bell right breast upper outer quadrant biopsy 09/28/2020 for a clinical T1a N0, stage IA invasive ductal carcinoma, grade 2, estrogen and progesterone receptor positive, HER-2 negative, with an MIB-1 of 10%. ° (a) the carcinoma measured 0.4 cm on the biopsy ° °(1) status Bell right lumpectomy and sentinel lymph node sampling 10/15/2020, showing no residual carcinoma in the breast, final pT1a pN0 ° (a) a total of one axillary lymph node removed ° °(2) adjuvant radiation completed 12/15/2020 ° °(3) considering antiestrogens for prophylaxis ° ° °PLAN: °Ariel Bell has completed local treatment for her very early stage breast cancer.  She is now ready to consider antiestrogens. ° °She understands that for cancers as small as hers the actual risk of dissemination is very low, 5% or so.  That means the benefit of any systemic treatment is going to be very small.  In fact as far as this cancer is concerned I do not think she needs any systemic treatment. ° °On the other hand she does have a higher than average risk of getting another breast cancer in the future simply because she had this 1.  That risk approaches 1 %/year.  She can cut in half by taking antiestrogens for 5 years. ° °We discussed the difference between tamoxifen and anastrozole in detail. She understands that anastrozole and the aromatase inhibitors in general work by blocking estrogen production. Accordingly vaginal dryness, decrease in bone density, and of course hot flashes can result. The aromatase  inhibitors can also negatively affect the cholesterol profile, although that is a minor effect. One out of 5 women on aromatase inhibitors we will feel "old and achy". This arthralgia/myalgia syndrome, which resembles fibromyalgia clinically, does resolve with stopping the medications. Accordingly this is not a reason to not try an aromatase inhibitor but it is a frequent reason to stop it (in other words 20% of women will not be able to tolerate these medications). ° °Tamoxifen on the other hand does not block estrogen production. It does not "take away a woman's estrogen". It blocks the estrogen receptor in breast cells. Like anastrozole, it can   can also cause hot flashes. As opposed to anastrozole, tamoxifen has many estrogen-like effects. It is technically an estrogen receptor modulator. This means that in some tissues tamoxifen works like estrogen-- for example it helps strengthen the bones. It tends to improve the cholesterol profile. It can cause thickening of the endometrial lining, and even endometrial polyps or rarely cancer of the uterus.(The risk of uterine cancer due to tamoxifen is one additional cancer per thousand women year). It can cause vaginal wetness or stickiness. It can cause blood clots through this estrogen-like effect--the risk of blood clots with tamoxifen is exactly the same as with birth control pills or hormone replacement.  Neither of these agents causes mood changes or weight gain, despite the popular belief that they can have these side effects. We have data from studies comparing either of these drugs with placebo, and in those cases the control group had the same amount of weight gain and depression as the group that took the drug.  After this discussion Ariel Bell would prefer to think about it and read up on it some more but is not convinced that she wants to take prophylactic antiestrogens.  I have no problems with that decision.  She will have her next mammogram in August and  yearly thereafter.  She will see me in September and yearly thereafter.  She knows to call for any other issue that may develop before the next visit  Total encounter time 25 minutes.Ariel Bell C. Arvil Utz, MD 12/15/2020 12:38 PM Medical Oncology and Hematology [facility] [address], [place] [address] Tel. [phone]    Fax. [phone]   This document serves as a record of services personally performed by Lurline Del, MD. It was created on his behalf by Wilburn Mylar, a trained medical scribe. The creation of this record is based on the scribe's personal observations and the provider's statements to them.   I, Lurline Del MD, have reviewed the above documentation for accuracy and completeness, and I agree with the above.   *Total Encounter Time as defined by the Centers for Medicare and Medicaid Services includes, in addition to the face-to-face time of a patient visit (documented in the note above) non-face-to-face time: obtaining and reviewing outside history, ordering and reviewing medications, tests or procedures, care coordination (communications with other health care professionals or caregivers) and documentation in the medical record.

## 2020-12-15 ENCOUNTER — Ambulatory Visit
Admission: RE | Admit: 2020-12-15 | Discharge: 2020-12-15 | Disposition: A | Discharge status: Home / Self Care | Payer: BC Managed Care – PPO | Source: Ambulatory Visit | Attending: Radiation Oncology | Admitting: Radiation Oncology

## 2020-12-15 ENCOUNTER — Inpatient Hospital Stay: Payer: BC Managed Care – PPO | Attending: Oncology | Admitting: Oncology

## 2020-12-15 ENCOUNTER — Inpatient Hospital Stay: Payer: BC Managed Care – PPO

## 2020-12-15 ENCOUNTER — Other Ambulatory Visit: Payer: Self-pay

## 2020-12-15 ENCOUNTER — Encounter: Payer: Self-pay | Admitting: Radiation Oncology

## 2020-12-15 VITALS — BP 129/66 | HR 70 | Temp 98.2°F | Resp 20 | Ht 62.0 in | Wt 134.5 lb

## 2020-12-15 DIAGNOSIS — I10 Essential (primary) hypertension: Secondary | ICD-10-CM | POA: Diagnosis not present

## 2020-12-15 DIAGNOSIS — Z923 Personal history of irradiation: Secondary | ICD-10-CM | POA: Diagnosis not present

## 2020-12-15 DIAGNOSIS — Z170 Estrogen receptor positive status [ER+]: Secondary | ICD-10-CM

## 2020-12-15 DIAGNOSIS — E039 Hypothyroidism, unspecified: Secondary | ICD-10-CM | POA: Diagnosis not present

## 2020-12-15 DIAGNOSIS — C50411 Malignant neoplasm of upper-outer quadrant of right female breast: Secondary | ICD-10-CM

## 2020-12-15 DIAGNOSIS — Z87891 Personal history of nicotine dependence: Secondary | ICD-10-CM | POA: Insufficient documentation

## 2020-12-15 DIAGNOSIS — Z79890 Hormone replacement therapy (postmenopausal): Secondary | ICD-10-CM | POA: Insufficient documentation

## 2020-12-15 DIAGNOSIS — Z79899 Other long term (current) drug therapy: Secondary | ICD-10-CM | POA: Diagnosis not present

## 2020-12-15 DIAGNOSIS — Z510 Encounter for antineoplastic radiation therapy: Secondary | ICD-10-CM | POA: Diagnosis not present

## 2020-12-15 LAB — CBC WITH DIFFERENTIAL (CANCER CENTER ONLY)
Abs Immature Granulocytes: 0.01 10*3/uL (ref 0.00–0.07)
Basophils Absolute: 0 10*3/uL (ref 0.0–0.1)
Basophils Relative: 1 %
Eosinophils Absolute: 0.4 10*3/uL (ref 0.0–0.5)
Eosinophils Relative: 7 %
HCT: 39 % (ref 36.0–46.0)
Hemoglobin: 13 g/dL (ref 12.0–15.0)
Immature Granulocytes: 0 %
Lymphocytes Relative: 19 %
Lymphs Abs: 0.9 10*3/uL (ref 0.7–4.0)
MCH: 33.1 pg (ref 26.0–34.0)
MCHC: 33.3 g/dL (ref 30.0–36.0)
MCV: 99.2 fL (ref 80.0–100.0)
Monocytes Absolute: 0.4 10*3/uL (ref 0.1–1.0)
Monocytes Relative: 7 %
Neutro Abs: 3.4 10*3/uL (ref 1.7–7.7)
Neutrophils Relative %: 66 %
Platelet Count: 174 10*3/uL (ref 150–400)
RBC: 3.93 MIL/uL (ref 3.87–5.11)
RDW: 12.8 % (ref 11.5–15.5)
WBC Count: 5.1 10*3/uL (ref 4.0–10.5)
nRBC: 0 % (ref 0.0–0.2)

## 2020-12-15 LAB — CMP (CANCER CENTER ONLY)
ALT: 13 U/L (ref 0–44)
AST: 18 U/L (ref 15–41)
Albumin: 4 g/dL (ref 3.5–5.0)
Alkaline Phosphatase: 75 U/L (ref 38–126)
Anion gap: 6 (ref 5–15)
BUN: 17 mg/dL (ref 6–20)
CO2: 29 mmol/L (ref 22–32)
Calcium: 9.3 mg/dL (ref 8.9–10.3)
Chloride: 105 mmol/L (ref 98–111)
Creatinine: 0.82 mg/dL (ref 0.44–1.00)
GFR, Estimated: >60 mL/min (ref >60)
Glucose, Bld: 91 mg/dL (ref 70–99)
Potassium: 3.9 mmol/L (ref 3.5–5.1)
Sodium: 140 mmol/L (ref 135–145)
Total Bilirubin: 1.6 mg/dL — ABNORMAL HIGH (ref 0.3–1.2)
Total Protein: 6.9 g/dL (ref 6.5–8.1)

## 2020-12-16 ENCOUNTER — Telehealth: Payer: Self-pay | Admitting: Oncology

## 2020-12-16 NOTE — Telephone Encounter (Signed)
Scheduled appts per 2/15 los. Pt confirmed appt date and time.  

## 2021-01-07 ENCOUNTER — Encounter: Payer: Self-pay | Admitting: *Deleted

## 2021-01-10 NOTE — Progress Notes (Signed)
Radiation Oncology         [phone] ________________________________  Name: Ariel Bell MRN: [id number]  Date: 01/11/2021  DOB: 06/25/1966  Follow-Up Visit Note  CC: Arvella Nigh, MD  Magrinat, Virgie Dad, MD    ICD-10-CM   1. Malignant neoplasm of upper-outer quadrant of right breast in female, estrogen receptor positive ([place])  C50.411    Z17.0     Diagnosis: StageT1A, N0, Mx, RightBreast UOQ,Invasive DuctalCarcinoma with DCIS, ER+/ PR+/ Her2-, Grade2  Interval Since Last Radiation: One month  Radiation Treatment Dates: 11/19/2020 through 12/15/2020  Site: Right breast Technique: 3D Total Dose (Gy): 42.72/42.72 Dose per Fx (Gy): 2.67 Completed Fx: 16/16 Beam Energies: 6X, 10X  Narrative:  The patient returns today for routine follow-up. Since the end of treatment, she was seen by Dr. Jana Hakim on 12/15/2020. They discussed antiestrogens for prophylaxis, which the patient did not believe she wanted to pursue.  On review of systems, she reports overall feeling well. She denies itching or discomfort within the breast area.  She denies any nipple discharge or bleeding.  ALLERGIES:  has No Known Allergies.  Meds: Current Outpatient Medications  Medication Sig Dispense Refill   cetirizine (ZYRTEC) 10 MG tablet Take 10 mg by mouth daily as needed for allergies.     hydrochlorothiazide (HYDRODIURIL) 25 MG tablet Take 25 mg by mouth daily.     ibandronate (BONIVA) 150 MG tablet Take 150 mg by mouth every 30 (thirty) days.     naproxen sodium (ALEVE) 220 MG tablet Take 220 mg by mouth daily as needed (mild pain).     SYNTHROID 75 MCG tablet Take 75 mcg by mouth daily before breakfast.     traMADol (ULTRAM) 50 MG tablet Take 1 tablet (50 mg total) by mouth every 6 (six) hours as needed. 10 tablet 0   No current facility-administered medications for this encounter.    Physical Findings: The patient is in no acute distress. Patient is alert and oriented.  height  is '5\' 2"'  (1.575 m) and weight is 134 lb 2 oz (60.8 kg). Her temporal temperature is 97 F (36.1 C) (abnormal). Her blood pressure is 116/67 and her pulse is 71. Her respiration is 18 and oxygen saturation is 99%.   Lungs are clear to auscultation bilaterally. Heart has regular rate and rhythm. No palpable cervical, supraclavicular, or axillary adenopathy. Abdomen soft, non-tender, normal bowel sounds. Left breast: No palpable mass, nipple discharge, or bleeding.  Right breast: Skin is well-healed.  Mild hyperpigmentation changes noted in the breast.  Mild edema noted.  No dominant mass appreciated in the breast nipple discharge or bleeding  Lab Findings: Lab Results  Component Value Date   WBC 5.1 12/15/2020   HGB 13.0 12/15/2020   HCT 39.0 12/15/2020   MCV 99.2 12/15/2020   PLT 174 12/15/2020    Radiographic Findings: No results found.  Impression: Stage T1A, N0, Mx, Right Breast UOQ, Invasive Ductal Carcinoma with DCIS, ER+ / PR+ / Her2-, Grade 2  The patient has recovered well from her radiation therapy.  No signs of recurrence on clinical exam today.  Plan: The patient will undergo a mammogram in August and then follow up with Dr. Jana Hakim on 07/06/2021. She will follow up with radiation oncology in as needed basis in light of her close follow-up with medical oncology.   ____________________________________   Blair Promise, PhD, MD  This document serves as a record of services personally performed by Gery Pray, MD. It was created on  his behalf by Clerance Lav, a trained medical scribe. The creation of this record is based on the scribe's personal observations and the provider's statements to them. This document has been checked and approved by the attending provider.

## 2021-01-11 ENCOUNTER — Other Ambulatory Visit: Payer: Self-pay

## 2021-01-11 ENCOUNTER — Encounter: Payer: Self-pay | Admitting: Radiation Oncology

## 2021-01-11 ENCOUNTER — Ambulatory Visit: Payer: Self-pay | Admitting: Physical Therapy

## 2021-01-11 ENCOUNTER — Ambulatory Visit
Admission: RE | Admit: 2021-01-11 | Discharge: 2021-01-11 | Disposition: A | Discharge status: Home / Self Care | Payer: BC Managed Care – PPO | Source: Ambulatory Visit | Attending: Radiation Oncology | Admitting: Radiation Oncology

## 2021-01-11 DIAGNOSIS — Z170 Estrogen receptor positive status [ER+]: Secondary | ICD-10-CM | POA: Diagnosis not present

## 2021-01-11 DIAGNOSIS — Z923 Personal history of irradiation: Secondary | ICD-10-CM | POA: Insufficient documentation

## 2021-01-11 DIAGNOSIS — Z79899 Other long term (current) drug therapy: Secondary | ICD-10-CM | POA: Diagnosis not present

## 2021-01-11 DIAGNOSIS — C50411 Malignant neoplasm of upper-outer quadrant of right female breast: Secondary | ICD-10-CM | POA: Insufficient documentation

## 2021-01-11 NOTE — Progress Notes (Signed)
Patient is here today for 1 month follow up to radiation completed to right breast on 12/15/2020.  Patient reports having pain 5 out of 10 to the left leg from stretching.  Patient states she had some mild fatigue during radiation and it has improved.  Appetite is good.  Denies having any swelling in the arm, axilla, breast.  Denies having any limitations in movement of the arm.  States rash and skin issues during radiation have resolved and states she has some mild hyperpigmentation to the area.  Denies having any open areas in the radiation field.    Vitals:   01/11/21 1509  BP: 116/67  Pulse: 71  Resp: 18  Temp: (!) 97 F (36.1 C)  TempSrc: Temporal  SpO2: 99%  Weight: 134 lb 2 oz (60.8 kg)  Height: 5\' 2"  (1.575 m)

## 2021-01-11 NOTE — Progress Notes (Incomplete)
  Patient Name: DAY GREB MRN: [id number] DOB: [birth year] Referring Physician: Lurline Del (Profile Not Attached) Date of Service: 12/15/2020  [facility] [place]                                                        End Of Treatment Note  Diagnoses: C50.411-Malignant neoplasm of upper-outer quadrant of right female breast  Cancer Staging: StageT1A, N0, Mx, RightBreast UOQ,Invasive DuctalCarcinoma with DCIS, ER+/ PR+/ Her2-, Grade2  Intent: Curative  Radiation Treatment Dates: 11/19/2020 through 12/15/2020  Site: Right breast Technique: 3D Total Dose (Gy): 42.72/42.72 Dose per Fx (Gy): 2.67 Completed Fx: 16/16 Beam Energies: 6X, 10X  Narrative: The patient tolerated radiation therapy relatively well. She did report fatigue, dizziness/lightheadedness, nausea, and vomiting. She denied limitations in movement of right arm or swelling of the right arm or right breast. Appetite remained stable. On examination, the right breast area showed mild hyperpigmentation changes and erythema.  Plan: The patient will follow-up with radiation oncology in one month.  ________________________________________________   Blair Promise, PhD, MD  This document serves as a record of services personally performed by Gery Pray, MD. It was created on his behalf by Clerance Lav, a trained medical scribe. The creation of this record is based on the scribe's personal observations and the provider's statements to them. This document has been checked and approved by the attending provider.

## 2021-01-18 ENCOUNTER — Other Ambulatory Visit: Payer: Self-pay

## 2021-01-18 ENCOUNTER — Ambulatory Visit: Payer: BC Managed Care – PPO | Attending: General Surgery

## 2021-01-18 DIAGNOSIS — Z483 Aftercare following surgery for neoplasm: Secondary | ICD-10-CM | POA: Insufficient documentation

## 2021-01-18 NOTE — Therapy (Signed)
[place], [place], [address] Phone: [phone]   Fax:  [phone]  Physical Therapy Treatment  Patient Details  Name: Ariel Bell MRN: [id number] Date of Birth: 1965/11/15 Referring Provider (PT): Dr. Rolm Bookbinder   Encounter Date: 01/18/2021   PT End of Session - 01/18/21 1318    Visit Number 2   # unchanged due to screen only   Number of Visits 2    Date for PT Re-Evaluation 12/02/20    PT Start Time 1307    PT Stop Time 1316    PT Time Calculation (min) 9 min    Activity Tolerance Patient tolerated treatment well    Behavior During Therapy [facility] for tasks assessed/performed           Past Medical History:  Diagnosis Date  . Breast cancer ([place])   . Heart murmur   . History of radiation therapy 11/19/2020-12/15/2020   Right Breast IMRT; Dr. Gery Pray  . Hypertension   . Hypothyroidism     Past Surgical History:  Procedure Laterality Date  . AXILLARY SENTINEL NODE BIOPSY Right 10/15/2020   Procedure: RIGHT AXILLARY SENTINEL NODE BIOPSY;  Surgeon: Rolm Bookbinder, MD;  Location: [place] [facility];  Service: General;  Laterality: Right;  . BREAST LUMPECTOMY WITH RADIOACTIVE SEED AND SENTINEL LYMPH NODE BIOPSY Right 10/15/2020   Procedure: RIGHT BREAST LUMPECTOMY WITH RADIOACTIVE SEED;  Surgeon: Rolm Bookbinder, MD;  Location: [place];  Service: General;  Laterality: Right;  PEC BLOCK  . COLONOSCOPY    . WISDOM TOOTH EXTRACTION      There were no vitals filed for this visit.   Subjective Assessment - 01/18/21 1317    Subjective Pt returns for her 3 month L-Dex screen.    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 She underwent a right lumpectomy and sentinel node biopsy (1 negative node) on 10/15/2020 It is ER/PR positive and HER2 negative with a Ki67 of 10%.                  L-DEX FLOWSHEETS -  01/18/21 1300      L-DEX LYMPHEDEMA SCREENING   Measurement Type Unilateral    L-DEX MEASUREMENT EXTREMITY Upper Extremity    POSITION  Standing    DOMINANT SIDE Right    At Risk Side Right    BASELINE SCORE (UNILATERAL) -13    L-DEX SCORE (UNILATERAL) -8.5    VALUE CHANGE (UNILAT) 4.5                                  PT Long Term Goals - 11/12/20 1047      PT LONG TERM GOAL #1   Title Patient will demonstrate she has regained full shoulder ROM and function post operatively compared to baselines.    Time 8    Period Weeks    Status Achieved                 Plan - 01/18/21 1318    Clinical Impression Statement Pt returns for her 3 month L-Dex screen. Her change from baseline of 4.5 is WNLs so no further treatment is required at this time except to cont every 3 month L-Dex screens which pt is agreeable to.    PT Next Visit Plan Cont every 3 month L-Dex screens for 2 years from [facility].  Consulted and Agree with Plan of Care Patient           Patient will benefit from skilled therapeutic intervention in order to improve the following deficits and impairments:     Visit Diagnosis: Aftercare following surgery for neoplasm     Problem List Patient Active Problem List   Diagnosis Date Noted  . Malignant neoplasm of upper-outer quadrant of right breast in female, estrogen receptor positive ([place]) 10/01/2020    Ariel Bell, PTA 01/18/2021, 1:20 PM  [place], [place], [address] Phone: [phone]   Fax:  [phone]  Name: Ariel Bell MRN: [id number] Date of Birth: 04-28-1966

## 2021-01-20 ENCOUNTER — Ambulatory Visit: Payer: BC Managed Care – PPO | Admitting: Physical Therapy

## 2021-02-08 ENCOUNTER — Ambulatory Visit: Payer: BC Managed Care – PPO

## 2021-04-26 ENCOUNTER — Ambulatory Visit: Payer: BC Managed Care – PPO | Attending: General Surgery | Admitting: Physical Therapy

## 2021-04-26 ENCOUNTER — Other Ambulatory Visit: Payer: Self-pay

## 2021-04-26 DIAGNOSIS — R293 Abnormal posture: Secondary | ICD-10-CM | POA: Insufficient documentation

## 2021-04-26 DIAGNOSIS — Z170 Estrogen receptor positive status [ER+]: Secondary | ICD-10-CM | POA: Insufficient documentation

## 2021-04-26 DIAGNOSIS — Z483 Aftercare following surgery for neoplasm: Secondary | ICD-10-CM | POA: Insufficient documentation

## 2021-04-26 DIAGNOSIS — C50411 Malignant neoplasm of upper-outer quadrant of right female breast: Secondary | ICD-10-CM | POA: Insufficient documentation

## 2021-04-26 NOTE — Therapy (Signed)
[place], [place], [address] Phone: [phone]   Fax:  [phone]  Physical Therapy Treatment  Patient Details  Name: Dessiree Sze MRN: [id number] Date of Birth: 04-Feb-1966 Referring Provider (PT): Dr. Rolm Bookbinder   Encounter Date: 04/26/2021   PT End of Session - 04/26/21 1319     Visit Number 2    Number of Visits 2    PT Start Time 1302    PT Stop Time 1318    PT Time Calculation (min) 16 min    Activity Tolerance Patient tolerated treatment well    Behavior During Therapy [facility] for tasks assessed/performed             Past Medical History:  Diagnosis Date   Breast cancer ([place])    Heart murmur    History of radiation therapy 11/19/2020-12/15/2020   Right Breast IMRT; Dr. Gery Pray   Hypertension    Hypothyroidism     Past Surgical History:  Procedure Laterality Date   AXILLARY SENTINEL NODE BIOPSY Right 10/15/2020   Procedure: RIGHT AXILLARY SENTINEL NODE BIOPSY;  Surgeon: Rolm Bookbinder, MD;  Location: [place];  Service: General;  Laterality: Right;   BREAST LUMPECTOMY WITH RADIOACTIVE SEED AND SENTINEL LYMPH NODE BIOPSY Right 10/15/2020   Procedure: RIGHT BREAST LUMPECTOMY WITH RADIOACTIVE SEED;  Surgeon: Rolm Bookbinder, MD;  Location: [place];  Service: General;  Laterality: Right;  PEC BLOCK   COLONOSCOPY     WISDOM TOOTH EXTRACTION      There were no vitals filed for this visit.   Subjective Assessment - 04/26/21 1317     Subjective Pt returns for her 3 month L-Dex screen.                    L-DEX FLOWSHEETS - 04/26/21 1300       L-DEX LYMPHEDEMA SCREENING   Measurement Type Unilateral    L-DEX MEASUREMENT EXTREMITY Upper Extremity    POSITION  Standing    DOMINANT SIDE Right    At Risk Side Right    BASELINE SCORE (UNILATERAL) -13    L-DEX SCORE (UNILATERAL) -8.6    VALUE CHANGE (UNILAT) 4.4                                     PT Long Term Goals - 11/12/20 1047       PT LONG TERM GOAL #1   Title Patient will demonstrate she has regained full shoulder ROM and function post operatively compared to baselines.    Time 8    Period Weeks    Status Achieved                   Plan - 04/26/21 1319     Clinical Impression Statement Pt returned for a 3 month L-Dex screen. Her change from baseline is 4.4. She was tested twice today because her first score was 6.5 from baseline but her second score was 4.4. She has no signs of lymphedema and her risk is very small so we agreed to retest in 3 months without a compression sleeve. Her baseline was abnormal for unknown reasons which is throwing off her follow up scores.    PT Next Visit Plan Cont every 3 month L-Dex screens for 2 years from [facility].             Patient will benefit from skilled  therapeutic intervention in order to improve the following deficits and impairments:  Postural dysfunction, Decreased range of motion, Decreased knowledge of precautions, Impaired UE functional use, Pain, Decreased scar mobility  Visit Diagnosis: Aftercare following surgery for neoplasm  Malignant neoplasm of upper-outer quadrant of right breast in female, estrogen receptor positive ([place])  Abnormal posture     Problem List Patient Active Problem List   Diagnosis Date Noted   Malignant neoplasm of upper-outer quadrant of right breast in female, estrogen receptor positive ([place]) 10/01/2020   [name], PT 04/26/21 1:22 PM   [place], [place], [address] Phone: [phone]   Fax:  [phone]  Name: Atalia Litzinger MRN: [id number] Date of Birth: July 09, 1966

## 2021-07-01 ENCOUNTER — Telehealth: Payer: Self-pay | Admitting: Oncology

## 2021-07-01 ENCOUNTER — Ambulatory Visit
Admission: RE | Admit: 2021-07-01 | Discharge: 2021-07-01 | Disposition: A | Discharge status: Home / Self Care | Payer: BC Managed Care – PPO | Source: Ambulatory Visit | Attending: Oncology | Admitting: Oncology

## 2021-07-01 ENCOUNTER — Other Ambulatory Visit: Payer: Self-pay

## 2021-07-01 ENCOUNTER — Telehealth: Payer: Self-pay | Admitting: *Deleted

## 2021-07-01 DIAGNOSIS — C50411 Malignant neoplasm of upper-outer quadrant of right female breast: Secondary | ICD-10-CM

## 2021-07-01 DIAGNOSIS — Z170 Estrogen receptor positive status [ER+]: Secondary | ICD-10-CM

## 2021-07-01 NOTE — Telephone Encounter (Signed)
R/s 9/6 appt per sch msg, patient requested. Called and left msg. Mailed printout

## 2021-07-01 NOTE — Telephone Encounter (Signed)
This RN spoke with pt per her VM stating she was at the [facility] for her mammogram and was told " it would not be covered by her insurance and her out of pocket cost could be like $629 with payment expected today because I am not due to my yearly screening until November"  She did not get the mammogram " because my insurance covers this ".  This RN discussed possible out of pocket cost may be incurred due to need for diagnostic mammogram since she has had breast cancer. Pt stated she would prefer to do screening since her insurance pays fully for it.  This RN contacted the breast center and per further inquiry the techician informed the pt now that she is having a diagnostic mammogram she would have an out of pocket expense.  This RN called pt to discuss further per above miscommunication- obtained identified VM. Message left requesting a return call to discuss further as well as apologizing for the miscommunication. Stated that she may want to call her insurance company and discuss cost with them.  Pt called back to this RN ( she had not listened to the VM but noted missed call ) - above discussed with pt verbalizing understanding and appreciation of discussion.  She states she will reschedule her appt and if needed she will call to reschedule appt with Dr Jana Hakim.

## 2021-07-06 ENCOUNTER — Other Ambulatory Visit: Payer: BC Managed Care – PPO

## 2021-07-06 ENCOUNTER — Ambulatory Visit: Payer: BC Managed Care – PPO | Admitting: Oncology

## 2021-07-17 ENCOUNTER — Other Ambulatory Visit: Payer: Self-pay

## 2021-07-17 ENCOUNTER — Ambulatory Visit
Admission: RE | Admit: 2021-07-17 | Discharge: 2021-07-17 | Disposition: A | Discharge status: Home / Self Care | Payer: BC Managed Care – PPO | Source: Ambulatory Visit | Attending: Oncology | Admitting: Oncology

## 2021-07-17 HISTORY — DX: Personal history of irradiation: Z92.3

## 2021-07-20 ENCOUNTER — Other Ambulatory Visit: Payer: Self-pay

## 2021-07-20 DIAGNOSIS — Z170 Estrogen receptor positive status [ER+]: Secondary | ICD-10-CM

## 2021-07-20 DIAGNOSIS — C50411 Malignant neoplasm of upper-outer quadrant of right female breast: Secondary | ICD-10-CM

## 2021-07-21 ENCOUNTER — Inpatient Hospital Stay: Payer: BC Managed Care – PPO | Attending: Oncology

## 2021-07-21 ENCOUNTER — Inpatient Hospital Stay (HOSPITAL_BASED_OUTPATIENT_CLINIC_OR_DEPARTMENT_OTHER): Payer: BC Managed Care – PPO | Admitting: Oncology

## 2021-07-21 ENCOUNTER — Other Ambulatory Visit: Payer: Self-pay

## 2021-07-21 VITALS — BP 142/60 | HR 71 | Temp 97.9°F | Resp 18 | Ht 62.0 in | Wt 136.8 lb

## 2021-07-21 DIAGNOSIS — Z79899 Other long term (current) drug therapy: Secondary | ICD-10-CM | POA: Insufficient documentation

## 2021-07-21 DIAGNOSIS — E039 Hypothyroidism, unspecified: Secondary | ICD-10-CM | POA: Insufficient documentation

## 2021-07-21 DIAGNOSIS — Z170 Estrogen receptor positive status [ER+]: Secondary | ICD-10-CM

## 2021-07-21 DIAGNOSIS — M8580 Other specified disorders of bone density and structure, unspecified site: Secondary | ICD-10-CM | POA: Insufficient documentation

## 2021-07-21 DIAGNOSIS — C50411 Malignant neoplasm of upper-outer quadrant of right female breast: Secondary | ICD-10-CM | POA: Diagnosis not present

## 2021-07-21 DIAGNOSIS — Z923 Personal history of irradiation: Secondary | ICD-10-CM | POA: Diagnosis not present

## 2021-07-21 DIAGNOSIS — I10 Essential (primary) hypertension: Secondary | ICD-10-CM | POA: Insufficient documentation

## 2021-07-21 DIAGNOSIS — Z87891 Personal history of nicotine dependence: Secondary | ICD-10-CM | POA: Diagnosis not present

## 2021-07-21 LAB — CMP (CANCER CENTER ONLY)
ALT: 16 U/L (ref 0–44)
AST: 23 U/L (ref 15–41)
Albumin: 4.2 g/dL (ref 3.5–5.0)
Alkaline Phosphatase: 71 U/L (ref 38–126)
Anion gap: 10 (ref 5–15)
BUN: 13 mg/dL (ref 6–20)
CO2: 28 mmol/L (ref 22–32)
Calcium: 9.5 mg/dL (ref 8.9–10.3)
Chloride: 103 mmol/L (ref 98–111)
Creatinine: 0.77 mg/dL (ref 0.44–1.00)
GFR, Estimated: >60 mL/min (ref >60)
Glucose, Bld: 89 mg/dL (ref 70–99)
Potassium: 4 mmol/L (ref 3.5–5.1)
Sodium: 141 mmol/L (ref 135–145)
Total Bilirubin: 1.1 mg/dL (ref 0.3–1.2)
Total Protein: 7.2 g/dL (ref 6.5–8.1)

## 2021-07-21 LAB — CBC WITH DIFFERENTIAL (CANCER CENTER ONLY)
Abs Immature Granulocytes: 0.01 10*3/uL (ref 0.00–0.07)
Basophils Absolute: 0 10*3/uL (ref 0.0–0.1)
Basophils Relative: 1 %
Eosinophils Absolute: 0.2 10*3/uL (ref 0.0–0.5)
Eosinophils Relative: 4 %
HCT: 38.6 % (ref 36.0–46.0)
Hemoglobin: 13.2 g/dL (ref 12.0–15.0)
Immature Granulocytes: 0 %
Lymphocytes Relative: 43 %
Lymphs Abs: 1.7 10*3/uL (ref 0.7–4.0)
MCH: 34 pg (ref 26.0–34.0)
MCHC: 34.2 g/dL (ref 30.0–36.0)
MCV: 99.5 fL (ref 80.0–100.0)
Monocytes Absolute: 0.3 10*3/uL (ref 0.1–1.0)
Monocytes Relative: 8 %
Neutro Abs: 1.8 10*3/uL (ref 1.7–7.7)
Neutrophils Relative %: 44 %
Platelet Count: 184 10*3/uL (ref 150–400)
RBC: 3.88 MIL/uL (ref 3.87–5.11)
RDW: 13.1 % (ref 11.5–15.5)
WBC Count: 4 10*3/uL (ref 4.0–10.5)
nRBC: 0 % (ref 0.0–0.2)

## 2021-07-21 NOTE — Progress Notes (Signed)
[facility]  [phone] [phone]     ID: Ariel Bell DOB: 08-02-1966  MR#: [id number]  HCW#:[id number]  Patient Care Team: Arvella Nigh, MD as PCP - General (Obstetrics and Gynecology) Mauro Kaufmann, RN as Oncology Nurse Navigator Rockwell Germany, RN as Oncology Nurse Navigator Gery Pray, MD as Consulting Physician (Radiation Oncology) Tanisha Lutes, Virgie Dad, MD as Consulting Physician (Oncology) Rolm Bookbinder, MD as Consulting Physician (General Surgery) Ulla Gallo, MD as Consulting Physician (Dermatology) Chauncey Cruel, MD OTHER MD:  CHIEF COMPLAINT: estrogen receptor positive breast cancer  CURRENT TREATMENT: observation   INTERVAL HISTORY: Ariel Bell returns today for follow up of her estrogen receptor positive breast cancer.   Since her last visit, she underwent bilateral diagnostic mammography with tomography at [facility] on 07/17/2021 showing: breast density category C; no evidence of malignancy in either breast.   At the last visit we discussed antiestrogens for prophylaxis.  She has decided not to start those medications.   REVIEW OF SYSTEMS: Ariel Bell moved into her own condo and is very pleased with it.  She is very impressed with the [facility] and would like to go to the gym there if we could somehow arrange for that.  She is working with [facility] and she tells me her insurance will not pay for them unless the doctor writes a prescription.  Aside from that a detailed review of systems today was stable   COVID 19 VACCINATION STATUS: Not vaccinated as of 12/22/2020   HISTORY OF CURRENT ILLNESS: From the original intake note:   Ariel Bell had routine screening mammography on 09/02/2020 showing a possible abnormality in the right breast. She underwent right diagnostic mammography with tomography and right breast ultrasonography at [facility] on 09/23/2020 showing: breast density  category C; 5 mm mass involving upper-outer right breast with a vague shadowing focus which is likely the sonographic correlate; no pathologic right axillary lymphadenopathy.  Accordingly on 09/28/2020 she proceeded to biopsy of the right breast area in question. The pathology from this procedure ([id number]) showed: invasive and in situ mammary carcinoma, e-cadherin positive, grade 2. Prognostic indicators significant for: estrogen receptor, 100% positive and progesterone receptor, 90% positive, both with strong staining intensity. Proliferation marker Ki67 at 10%. HER2 equivocal by immunohistochemistry (2+), but negative by fluorescent in situ hybridization with a signals ratio 1.41 and number per cell 2.05.  The patient's subsequent history is as detailed below.   PAST MEDICAL HISTORY: Past Medical History:  Diagnosis Date   Breast cancer ([place])    Heart murmur    History of radiation therapy 11/19/2020-12/15/2020   Right Breast IMRT; Dr. Gery Pray   Hypertension    Hypothyroidism    Personal history of radiation therapy     PAST SURGICAL HISTORY: Past Surgical History:  Procedure Laterality Date   AXILLARY SENTINEL NODE BIOPSY Right 10/15/2020   Procedure: RIGHT AXILLARY SENTINEL NODE BIOPSY;  Surgeon: Rolm Bookbinder, MD;  Location: [facility];  Service: General;  Laterality: Right;   BREAST BIOPSY Right 08/2020   BREAST LUMPECTOMY Right 10/14/2020   BREAST LUMPECTOMY WITH RADIOACTIVE SEED AND SENTINEL LYMPH NODE BIOPSY Right 10/15/2020   Procedure: RIGHT BREAST LUMPECTOMY WITH RADIOACTIVE SEED;  Surgeon: Rolm Bookbinder, MD;  Location: [facility];  Service: General;  Laterality: Right;  PEC BLOCK   COLONOSCOPY     WISDOM TOOTH EXTRACTION      FAMILY HISTORY: Family History  Problem Relation Age of Onset  COPD Father    Her father died in his 74's from COPD/E. Her mother is age 57 as of 09/2020. Ariel Bell has two brothers and one sister. There is no family history of cancer to  her knowledge.    GYNECOLOGIC HISTORY:  No LMP recorded. Patient is postmenopausal. Menarche: 55 years old [place] P 0 LMP age 25 HRT never used  Hysterectomy? no BSO? 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 She is divorced. She is currently staying with her niece Trinna Post because she just sold her house, but she is planning to move out as soon as she finds her own place to live.    ADVANCED DIRECTIVES: not in place, plans to name her sister [name] as her HCPOA.   HEALTH MAINTENANCE: Social History   Tobacco Use   Smoking status: Former    Types: Cigarettes    Quit date: 1995    Years since quitting: 27.7   Smokeless tobacco: Never  Vaping Use   Vaping Use: Never used  Substance Use Topics   Alcohol use: Yes    Alcohol/week: 14.0 standard drinks    Types: 14 Glasses of wine per week   Drug use: Never     Colonoscopy: 2018?  PAP: up to date  Bone density: osteopenia   No Known Allergies  Current Outpatient Medications  Medication Sig Dispense Refill   cetirizine (ZYRTEC) 10 MG tablet Take 10 mg by mouth daily as needed for allergies.     hydrochlorothiazide (HYDRODIURIL) 25 MG tablet Take 25 mg by mouth daily.     ibandronate (BONIVA) 150 MG tablet Take 150 mg by mouth every 30 (thirty) days.     naproxen sodium (ALEVE) 220 MG tablet Take 220 mg by mouth daily as needed (mild pain).     SYNTHROID 75 MCG tablet Take 75 mcg by mouth daily before breakfast.     traMADol (ULTRAM) 50 MG tablet Take 1 tablet (50 mg total) by mouth every 6 (six) hours as needed. 10 tablet 0   No current facility-administered medications for this visit.    OBJECTIVE: White woman who appears well  Vitals:   07/21/21 1314  BP: (!) 142/60  Pulse: 71  Resp: 18  Temp: 97.9 F (36.6 C)  SpO2: 100%      Body mass index is 25.02 kg/m.   Wt Readings from Last 3 Encounters:  07/21/21 136 lb 12.8 oz (62.1 kg)   01/11/21 134 lb 2 oz (60.8 kg)  12/15/20 134 lb 8 oz (61 kg)      ECOG FS:1 - Symptomatic but completely ambulatory  Sclerae unicteric, EOMs intact Wearing a mask No cervical or supraclavicular adenopathy Lungs no rales or rhonchi Heart regular rate and rhythm Abd soft, nontender, positive bowel sounds MSK no focal spinal tenderness, no upper extremity lymphedema Neuro: nonfocal, well oriented, appropriate affect Breasts: The right breast is status post lumpectomy and radiation.  There is some firmness but no evidence of residual or recurrent disease.  The cosmetic result is good.  The left breast and both axillae are benign   LAB RESULTS:  CMP     Component Value Date/Time   NA 141 07/21/2021 1245   K 4.0 07/21/2021 1245   CL 103 07/21/2021 1245   CO2 28 07/21/2021 1245   GLUCOSE 89 07/21/2021 1245   BUN 13 07/21/2021 1245   CREATININE 0.77 07/21/2021 1245   CALCIUM 9.5 07/21/2021  1245   PROT 7.2 07/21/2021 1245   ALBUMIN 4.2 07/21/2021 1245   AST 23 07/21/2021 1245   ALT 16 07/21/2021 1245   ALKPHOS 71 07/21/2021 1245   BILITOT 1.1 07/21/2021 1245   GFRNONAA >60 07/21/2021 1245    No results found for: TOTALPROTELP, ALBUMINELP, A1GS, A2GS, BETS, BETA2SER, GAMS, MSPIKE, SPEI  Lab Results  Component Value Date   WBC 4.0 07/21/2021   NEUTROABS 1.8 07/21/2021   HGB 13.2 07/21/2021   HCT 38.6 07/21/2021   MCV 99.5 07/21/2021   PLT 184 07/21/2021    No results found for: LABCA2  No components found for: FXTKWI097  No results for input(s): INR in the last 168 hours.  No results found for: LABCA2  No results found for: DZH299  No results found for: MEQ683  No results found for: MHD622  No results found for: CA2729  No components found for: HGQUANT  No results found for: CEA1 / No results found for: CEA1   No results found for: AFPTUMOR  No results found for: CHROMOGRNA  No results found for: KPAFRELGTCHN, LAMBDASER, KAPLAMBRATIO (kappa/lambda  light chains)  No results found for: HGBA, HGBA2QUANT, HGBFQUANT, HGBSQUAN (Hemoglobinopathy evaluation)   No results found for: LDH  No results found for: IRON, TIBC, IRONPCTSAT (Iron and TIBC)  No results found for: FERRITIN  Urinalysis No results found for: COLORURINE, APPEARANCEUR, LABSPEC, PHURINE, GLUCOSEU, HGBUR, BILIRUBINUR, KETONESUR, PROTEINUR, UROBILINOGEN, NITRITE, LEUKOCYTESUR   STUDIES: MM DIAG BREAST TOMO BILATERAL  Result Date: 07/17/2021 CLINICAL DATA:  Patient presents for a bilateral diagnostic exam due to history of right malignant lumpectomy November 2021. EXAM: DIGITAL DIAGNOSTIC BILATERAL MAMMOGRAM WITH TOMOSYNTHESIS AND CAD TECHNIQUE: Bilateral digital diagnostic mammography and breast tomosynthesis was performed. The images were evaluated with computer-aided detection. COMPARISON:  Previous exam(s). ACR Breast Density Category c: The breast tissue is heterogeneously dense, which may obscure small masses. FINDINGS: Exam demonstrates expected post lumpectomy changes over the posterior upper outer right breast. Remainder of the right breast as well as the left breast is unchanged. IMPRESSION: Expected post lumpectomy changes over the upper-outer right breast. RECOMMENDATION: Recommend continued annual bilateral diagnostic mammographic evaluation. I have discussed the findings and recommendations with the patient. If applicable, a reminder letter will be sent to the patient regarding the next appointment. BI-RADS CATEGORY  2: Benign. Electronically Signed   By: Marin Olp M.D.   On: 07/17/2021 08:28    ELIGIBLE FOR AVAILABLE RESEARCH PROTOCOL: AET  ASSESSMENT: 55 y.o. [place] woman status post right breast upper outer quadrant biopsy 09/28/2020 for a clinical T1a N0, stage IA invasive ductal carcinoma, grade 2, estrogen and progesterone receptor positive, HER-2 negative, with an MIB-1 of 10%.  (a) the carcinoma measured 0.4 cm on the biopsy  (1) status post right  lumpectomy and sentinel lymph node sampling 10/15/2020, showing no residual carcinoma in the breast, final pT1a pN0  (a) a total of one axillary lymph node removed  (2) adjuvant radiation 11/19/2020 through 12/15/2020  Site: Right breast Technique: 3D Total Dose (Gy): 42.72/42.72 Dose per Fx (Gy): 2.67 Completed Fx: 16/16 Beam Energies: 6X, 10X   (3) opted against antiestrogens for prophylaxis   PLAN: Ariel Bell is now coming up on 1 year from definitive surgery for her breast cancer, with no evidence of disease recurrence.  This is very favorable.  There was some confusion apparently by a tech at the [facility] regarding the fact that she had her mammogram in September instead of waiting a full year from  her last one.  This was a New Baseline Mammogram and we do not need to wait a year to have this new series started.  I wrote that down for her so that she could discuss that if there is a billing issue.  I was not comfortable writing a prescription for [facility] since I am not familiar with what they do or there are products.  In general I feel uncomfortable with foods being promoted as medications.  She will have her next mammogram in September 2023 and that will be diagnostic, after which she will be switched to screening  She requested a referral to physical therapy which I was glad to provide  She will see [place] again in a year.  She knows to call for any other issue that may develop before then  Total encounter time 25 minutes.Sarajane Jews C. Kalese Ensz, MD 07/21/2021 1:31 PM Medical Oncology and Hematology [facility] [place], [place] [address] Tel. [phone]    Fax. [phone]   This document serves as a record of services personally performed by Lurline Del, MD. It was created on his behalf by Wilburn Mylar, a trained medical scribe. The creation of this record is based on the scribe's personal observations and the provider's  statements to them.   I, Lurline Del MD, have reviewed the above documentation for accuracy and completeness, and I agree with the above.   *Total Encounter Time as defined by the Centers for Medicare and Medicaid Services includes, in addition to the face-to-face time of a patient visit (documented in the note above) non-face-to-face time: obtaining and reviewing outside history, ordering and reviewing medications, tests or procedures, care coordination (communications with other health care professionals or caregivers) and documentation in the medical record.

## 2021-07-26 ENCOUNTER — Ambulatory Visit: Payer: BC Managed Care – PPO | Attending: General Surgery | Admitting: Physical Therapy

## 2021-07-26 ENCOUNTER — Other Ambulatory Visit: Payer: Self-pay

## 2021-07-26 DIAGNOSIS — Z483 Aftercare following surgery for neoplasm: Secondary | ICD-10-CM | POA: Insufficient documentation

## 2021-07-26 NOTE — Therapy (Signed)
[place], [place], [address] Phone: [phone]   Fax:  [phone]  Physical Therapy Treatment  Patient Details  Name: Ariel Bell MRN: [id number] Date of Birth: May 25, 1966 Referring Provider (PT): Dr. Rolm Bookbinder   Encounter Date: 07/26/2021   PT End of Session - 07/26/21 1309     Visit Number 2    Number of Visits 2    PT Start Time 1300    PT Stop Time 7353    PT Time Calculation (min) 7 min             Past Medical History:  Diagnosis Date   Breast cancer ([place])    Heart murmur    History of radiation therapy 11/19/2020-12/15/2020   Right Breast IMRT; Dr. Gery Pray   Hypertension    Hypothyroidism    Personal history of radiation therapy     Past Surgical History:  Procedure Laterality Date   AXILLARY SENTINEL NODE BIOPSY Right 10/15/2020   Procedure: RIGHT AXILLARY SENTINEL NODE BIOPSY;  Surgeon: Rolm Bookbinder, MD;  Location: [facility];  Service: General;  Laterality: Right;   BREAST BIOPSY Right 08/2020   BREAST LUMPECTOMY Right 10/14/2020   BREAST LUMPECTOMY WITH RADIOACTIVE SEED AND SENTINEL LYMPH NODE BIOPSY Right 10/15/2020   Procedure: RIGHT BREAST LUMPECTOMY WITH RADIOACTIVE SEED;  Surgeon: Rolm Bookbinder, MD;  Location: [facility];  Service: General;  Laterality: Right;  PEC BLOCK   COLONOSCOPY     WISDOM TOOTH EXTRACTION      There were no vitals filed for this visit.   Subjective Assessment - 07/26/21 1308     Subjective Patient is here for [facility] screen                    L-DEX FLOWSHEETS - 07/26/21 1300       L-DEX LYMPHEDEMA SCREENING   Measurement Type Unilateral    L-DEX MEASUREMENT EXTREMITY Upper Extremity    POSITION  Standing    DOMINANT SIDE Right    At Risk Side Right    BASELINE SCORE (UNILATERAL) -13    L-DEX SCORE (UNILATERAL) -8.8    VALUE CHANGE (UNILAT) 4.2                                      PT Long Term Goals - 11/12/20 1047       PT LONG TERM GOAL #1   Title Patient will demonstrate she has regained full shoulder ROM and function post operatively compared to baselines.    Time 8    Period Weeks    Status Achieved                   Plan - 07/26/21 1309     Clinical Impression Statement Pt wihtin good range from baseline. No conerns of lymphedema.    PT Next Visit Plan Cont every 3 month L-Dex screens for 2 years from [facility].    Consulted and Agree with Plan of Care Patient             Patient will benefit from skilled therapeutic intervention in order to improve the following deficits and impairments:     Visit Diagnosis: Aftercare following surgery for neoplasm     Problem List Patient Active Problem List   Diagnosis Date Noted   Malignant neoplasm of upper-outer quadrant of right breast in female, estrogen  receptor positive ([place]) 10/01/2020   Ariel Bell, PT 07/26/21 1:10 PM   [address] [place] [place], [place], [address] Phone: [phone]   Fax:  [phone]  Name: Ariel Bell MRN: [id number] Date of Birth: 1966-08-07

## 2021-08-11 ENCOUNTER — Ambulatory Visit: Payer: BC Managed Care – PPO | Attending: Oncology | Admitting: Rehabilitation

## 2021-08-11 ENCOUNTER — Encounter: Payer: Self-pay | Admitting: Rehabilitation

## 2021-08-11 ENCOUNTER — Other Ambulatory Visit: Payer: Self-pay

## 2021-08-11 DIAGNOSIS — Z483 Aftercare following surgery for neoplasm: Secondary | ICD-10-CM | POA: Insufficient documentation

## 2021-08-11 DIAGNOSIS — C50411 Malignant neoplasm of upper-outer quadrant of right female breast: Secondary | ICD-10-CM | POA: Diagnosis present

## 2021-08-11 DIAGNOSIS — R293 Abnormal posture: Secondary | ICD-10-CM | POA: Insufficient documentation

## 2021-08-11 DIAGNOSIS — Z170 Estrogen receptor positive status [ER+]: Secondary | ICD-10-CM | POA: Insufficient documentation

## 2021-08-11 NOTE — Patient Instructions (Signed)
Access Code: [email or web address]: [email or web address]: 10/12/2022Prepared by: Marcene Brawn TevisExercises  Seated Cervical Sidebending Stretch - 1 x daily - 7 x weekly - 1-3 sets - 10 reps - 20-30 seconds hold  Seated Scapular Retraction - 1 x daily - 7 x weekly - 1-3 sets - 10 reps - 2-3 seconds hold  Single Arm Doorway Pec Stretch at 90 Degrees Abduction - 1 x daily - 7 x weekly - 3 reps - 20-30second hold  Standing Bicep Stretch at Wall - 1 x daily - 7 x weekly - 3 reps - 20-30 seconds hold

## 2021-08-11 NOTE — Therapy (Signed)
[place] @ [place], [place], [address] Phone: [phone]   Fax:  [phone]  Physical Therapy Evaluation  Patient Details  Name: Ariel Bell MRN: [id number] Date of Birth: October 04, 1966 Referring Provider (PT): Dr. Rolm Bookbinder   Encounter Date: 08/11/2021   PT End of Session - 08/11/21 0854     Visit Number 3    Number of Visits 15    Date for PT Re-Evaluation 09/22/21    PT Start Time 0800    PT Stop Time 0845    PT Time Calculation (min) 45 min    Activity Tolerance Patient tolerated treatment well    Behavior During Therapy [facility] for tasks assessed/performed             Past Medical History:  Diagnosis Date   Breast cancer ([place])    Heart murmur    History of radiation therapy 11/19/2020-12/15/2020   Right Breast IMRT; Dr. Gery Pray   Hypertension    Hypothyroidism    Personal history of radiation therapy     Past Surgical History:  Procedure Laterality Date   AXILLARY SENTINEL NODE BIOPSY Right 10/15/2020   Procedure: RIGHT AXILLARY SENTINEL NODE BIOPSY;  Surgeon: Rolm Bookbinder, MD;  Location: [place];  Service: General;  Laterality: Right;   BREAST BIOPSY Right 08/2020   BREAST LUMPECTOMY Right 10/14/2020   BREAST LUMPECTOMY WITH RADIOACTIVE SEED AND SENTINEL LYMPH NODE BIOPSY Right 10/15/2020   Procedure: RIGHT BREAST LUMPECTOMY WITH RADIOACTIVE SEED;  Surgeon: Rolm Bookbinder, MD;  Location: [place];  Service: General;  Laterality: Right;  PEC BLOCK   COLONOSCOPY     WISDOM TOOTH EXTRACTION      There were no vitals filed for this visit.    Subjective Assessment - 08/11/21 0800     Subjective A little sore on the Rt side.  Maybe to do some strengthening and stretching. Maybe muscle sore around the incision.  No swelling.    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 She underwent a  right lumpectomy and sentinel node biopsy (1 negative node) on 10/15/2020 It is ER/PR positive and HER2 negative with a Ki67 of 10%.    Limitations --   none   Patient Stated Goals learn some strengthening    Currently in Pain? No/denies   not currently - neck hurts by the end of work   Pain Score 5     Pain Location Neck    Pain Orientation Mid    Pain Descriptors / Indicators Aching;Sore    Pain Type Chronic pain    Pain Onset More than a month ago    Pain Frequency Intermittent    Aggravating Factors  work can aggravate the axilla and the bil UT    Pain Relieving Factors rest, tylenol, heat on the neck             Dr. Jana Hakim also referring provider   [facility] PT Assessment - 08/11/21 0001       Assessment   Medical Diagnosis s/p right lumpectomy and SLNB    Referring Provider (PT) Dr. Rolm Bookbinder    Onset Date/Surgical Date 10/15/20    Hand Dominance Right    Prior Therapy Baselines      Precautions   Precaution Comments lymphedema Rt      Balance Screen   Has the patient fallen in the past 6 months No  Has the patient had a decrease in activity level because of a fear of falling?  No    Is the patient reluctant to leave their home because of a fear of falling?  No      Home Ecologist residence    Living Arrangements Alone      Prior Function   Level of Independence Independent    Vocation Full time employment    Printmaker full time; part-time at [employer] She is not exercising      Cognition   Overall Cognitive Status Within Functional Limits for tasks assessed      Observation/Other Assessments   Observations tightness in axilla with arm overhead flexion in supine- no edema      Posture/Postural Control   Postural Limitations Rounded Shoulders;Forward head      AROM   AROM Assessment Site Cervical    Right Shoulder Flexion 155 Degrees    Right Shoulder ABduction 170 Degrees    pectoralis tightness end range   Right Shoulder Internal Rotation --   behind the back WNL   Right Shoulder External Rotation --   behind the head WNL with slight pull   Cervical Flexion 60    Cervical Extension 40    Cervical - Right Side Bend 25    Cervical - Left Side Bend 25    Cervical - Right Rotation 65    Cervical - Left Rotation 70      Strength   Overall Strength Comments biceps 5/5    Strength Assessment Site Shoulder;Elbow    Right/Left Shoulder Right;Left    Right Shoulder Flexion 4-/5    Right Shoulder ABduction 4-/5    Right Shoulder Internal Rotation 5/5    Right Shoulder External Rotation 4/5    Left Shoulder Flexion 4-/5    Left Shoulder ABduction 4-/5    Left Shoulder Internal Rotation 5/5    Left Shoulder External Rotation 4/5      Palpation   Palpation comment no ttp neck muscles currently                        Objective measurements completed on examination: See above findings.       [facility] Adult PT Treatment/Exercise - 08/11/21 0001       Exercises   Exercises Other Exercises    Other Exercises  performed each TE x 1 with vcs and tcs for completion per instruction section      Manual Therapy   Manual Therapy Myofascial release;Soft tissue mobilization    Soft tissue mobilization to the right pectoralis and axilla in radiation position propped on pillow    Myofascial Release to the Rt a xilla towards the incision and past into breast tissue                     PT Education - 08/11/21 0853     Education Details POC, posture at work, new Chiropractor) Educated Patient    Methods Explanation;Demonstration;Tactile cues;Verbal cues;Handout    Comprehension Verbalized understanding;Returned demonstration;Verbal cues required;Tactile cues required;Need further instruction                 PT Long Term Goals - 08/11/21 0859       PT LONG TERM GOAL #1   Title Patient will demonstrate she has regained full  shoulder ROM and function post operatively compared to  baselines without pull    Time 6    Period Weeks    Status New      PT LONG TERM GOAL #2   Title Pt will report not having bad neck pain at the end of every work day due to improved posture    Time 6    Period Weeks    Status New      PT LONG TERM GOAL #3   Title Pt will be ind with final HEP for continued improved posture    Time 6    Period Weeks    Status New                    Plan - 08/11/21 0854     Clinical Impression Statement Pt returns to PT wanting to work on postural and shoulder strengthening and any ways to loosen up the Rt axillary region.  Pt demonstrates shoulder and cervical muscle weakness, tightness in the pectoralis and axilla towards the incision, and neck and UT region pain at the end of the day of work at a desk.  Discussed postural changes and focusing on shoulders back/chin down at work as well as desk set up which seems to be okay.  Quick session of STM and MFR which resulted in less tightness quickly.  Pt was educated on intial stretches to work on for her HEP    Personal Factors and Comorbidities Comorbidity 3+;Fitness    Comorbidities desk job, poor posture, hx of radiation and LSNB    Examination-Activity Limitations Lift    Examination-Participation Restrictions Occupation    Stability/Clinical Decision Making Stable/Uncomplicated    Clinical Decision Making Low    Rehab Potential Excellent    PT Frequency 2x / week    PT Duration 6 weeks    PT Treatment/Interventions ADLs/Self Care Home Management;Therapeutic exercise;Patient/family education;Moist Heat;Manual techniques;Dry needling    PT Next Visit Plan review HEP, STM/MFR Rt axilla and bil UT, start supine chin tuck work and supine scap to start shoulder and postural strengthening.  Cont every 3 month L-Dex screens for 2 years from SLNB.    PT Home Exercise Plan Post op shoulder ROM HEP, [id number], posture at work    [employer] and  Agree with Plan of Care Patient             Patient will benefit from skilled therapeutic intervention in order to improve the following deficits and impairments:  Postural dysfunction, Decreased range of motion, Decreased knowledge of precautions, Pain, Decreased scar mobility, Decreased strength  Visit Diagnosis: Aftercare following surgery for neoplasm  Malignant neoplasm of upper-outer quadrant of right breast in female, estrogen receptor positive ([place])  Abnormal posture     Problem List Patient Active Problem List   Diagnosis Date Noted   Malignant neoplasm of upper-outer quadrant of right breast in female, estrogen receptor positive ([place]) 10/01/2020    [name], PT 08/11/2021, 9:08 AM  [place] @ [place], [place], [address] Phone: [phone]   Fax:  [phone]  Name: Ariel Bell MRN: [id number] Date of Birth: June 25, 1966

## 2021-08-16 ENCOUNTER — Other Ambulatory Visit: Payer: Self-pay

## 2021-08-16 ENCOUNTER — Ambulatory Visit: Payer: BC Managed Care – PPO

## 2021-08-16 DIAGNOSIS — C50411 Malignant neoplasm of upper-outer quadrant of right female breast: Secondary | ICD-10-CM

## 2021-08-16 DIAGNOSIS — Z483 Aftercare following surgery for neoplasm: Secondary | ICD-10-CM

## 2021-08-16 DIAGNOSIS — Z170 Estrogen receptor positive status [ER+]: Secondary | ICD-10-CM

## 2021-08-16 DIAGNOSIS — R293 Abnormal posture: Secondary | ICD-10-CM

## 2021-08-16 NOTE — Therapy (Signed)
[facility] @ [place], [place], [address] Phone: [phone]   Fax:  [phone]  Physical Therapy Treatment  Patient Details  Name: Ariel Bell MRN: [id number] Date of Birth: June 16, 1966 Referring Provider (PT): Dr. Rolm Bookbinder   Encounter Date: 08/16/2021   PT End of Session - 08/16/21 1719     Visit Number 4    Number of Visits 15    Date for PT Re-Evaluation 09/22/21    PT Start Time 1610    PT Stop Time 1706    PT Time Calculation (min) 56 min    Activity Tolerance Patient tolerated treatment well    Behavior During Therapy [facility] for tasks assessed/performed             Past Medical History:  Diagnosis Date   Breast cancer ([place])    Heart murmur    History of radiation therapy 11/19/2020-12/15/2020   Right Breast IMRT; Dr. Gery Pray   Hypertension    Hypothyroidism    Personal history of radiation therapy     Past Surgical History:  Procedure Laterality Date   AXILLARY SENTINEL NODE BIOPSY Right 10/15/2020   Procedure: RIGHT AXILLARY SENTINEL NODE BIOPSY;  Surgeon: Rolm Bookbinder, MD;  Location: [place];  Service: General;  Laterality: Right;   BREAST BIOPSY Right 08/2020   BREAST LUMPECTOMY Right 10/14/2020   BREAST LUMPECTOMY WITH RADIOACTIVE SEED AND SENTINEL LYMPH NODE BIOPSY Right 10/15/2020   Procedure: RIGHT BREAST LUMPECTOMY WITH RADIOACTIVE SEED;  Surgeon: Rolm Bookbinder, MD;  Location: [place];  Service: General;  Laterality: Right;  PEC BLOCK   COLONOSCOPY     WISDOM TOOTH EXTRACTION      There were no vitals filed for this visit.   Subjective Assessment - 08/16/21 1611     Subjective Just got off work so my upper traps are a little tight. My Rt side is still tight also from the surgery and I know using the mouse on the computer at work all day isn't helping either.    Pertinent History Patient was diagnosed on 09/02/2020 with right grade II invasive ductal  carcinoma breast cancer with DCIS. She underwent a right lumpectomy and sentinel node biopsy (1 negative node) on 10/15/2020 It is ER/PR positive and HER2 negative with a Ki67 of 10%.    Patient Stated Goals learn some strengthening    Currently in Pain? No/denies                               [facility] Adult PT Treatment/Exercise - 08/16/21 0001       Neck Exercises: Supine   Neck Retraction 10 reps;5 secs      Shoulder Exercises: Supine   Horizontal ABduction Strengthening;Both;5 reps;Theraband    Theraband Level (Shoulder Horizontal ABduction) Level 1 (Yellow)    External Rotation Strengthening;Both;5 reps;Theraband    Theraband Level (Shoulder External Rotation) Level 1 (Yellow)    Flexion Strengthening;Both;5 reps;Theraband   Narrow and Wide Grip, 5x each rep   Theraband Level (Shoulder Flexion) Level 1 (Yellow)    Flexion Limitations UE fatigue reported with narrow grip and pt reports some tension in her cervical muscles    Diagonals Strengthening;Right;Left;5 reps;Theraband    Theraband Level (Shoulder Diagonals) Level 1 (Yellow)      Manual Therapy   Manual Therapy Myofascial release;Soft tissue mobilization;Passive ROM    Soft tissue mobilization to the right pectoralis, axilla  and lateral trunk; to bil upper traps    Myofascial Release to the Rt axilla towards the incision    Passive ROM --   In Supine into bil cervical rotation and side bending during STM to same area; then to Rt shoulder into flexion, abduction and D2 during MFR                    PT Education - 08/16/21 1627     Education Details Supien cervical retraction and then supine scapular series with yellow theraband    Person(s) Educated Patient    Methods Explanation;Demonstration;Handout    Comprehension Verbalized understanding;Returned demonstration                 PT Long Term Goals - 08/11/21 0859       PT LONG TERM GOAL #1   Title Patient will demonstrate she  has regained full shoulder ROM and function post operatively compared to baselines without pull    Time 6    Period Weeks    Status New      PT LONG TERM GOAL #2   Title Pt will report not having bad neck pain at the end of every work day due to improved posture    Time 6    Period Weeks    Status New      PT LONG TERM GOAL #3   Title Pt will be ind with final HEP for continued improved posture    Time 6    Period Weeks    Status New                   Plan - 08/16/21 1629     Clinical Impression Statement Continued with manual therapy focusing on decreasing bil upper trap tighness and MFR to Rt lateral trunk and axillary area. Also progressed HEP to include cervical retraction and supine scapular series with yellow theraband. Pt required multiple VCs after demo for correct UE positioning and to relax cervical muscles throughout. Pt reports feeling challenged by these exercises.    Personal Factors and Comorbidities Comorbidity 3+;Fitness    Comorbidities desk job, poor posture, hx of radiation and LSNB    Examination-Activity Limitations Lift    Examination-Participation Restrictions Occupation    Stability/Clinical Decision Making Stable/Uncomplicated    Rehab Potential Excellent    PT Frequency 2x / week    PT Duration 6 weeks    PT Treatment/Interventions ADLs/Self Care Home Management;Therapeutic exercise;Patient/family education;Moist Heat;Manual techniques;Dry needling    PT Next Visit Plan review HEP, STM/MFR Rt axilla and bil UT, review supine chin tuck and supine scap to start shoulder and postural strengthening.  Cont every 3 month L-Dex screens for 2 years from SLNB.    PT Home Exercise Plan Post op shoulder ROM HEP, [id number], posture at work; cervical retraction an dsupien scapular series    Consulted and Agree with Plan of Care Patient             Patient will benefit from skilled therapeutic intervention in order to improve the following deficits and  impairments:  Postural dysfunction, Decreased range of motion, Decreased knowledge of precautions, Pain, Decreased scar mobility, Decreased strength  Visit Diagnosis: Aftercare following surgery for neoplasm  Malignant neoplasm of upper-outer quadrant of right breast in female, estrogen receptor positive ([place])  Abnormal posture     Problem List Patient Active Problem List   Diagnosis Date Noted   Malignant neoplasm of upper-outer quadrant of right breast  in female, estrogen receptor positive ([place]) 10/01/2020    Otelia Limes, PTA 08/16/2021, 5:21 PM  [facility] @ [place], [place], [address] Phone: [phone]   Fax:  [phone]  Name: WINTER JOCELYN MRN: [id number] Date of Birth: 1965-12-13

## 2021-08-16 NOTE — Patient Instructions (Signed)
Flexibility: Neck Retraction    Pull head straight back, keeping eyes and jaw level. Repeat ___3-5_ times per set. Hold 5 seconds. Do __3-5__ sessions per day.  Over Head Pull: Narrow and Wide Grip   Cancer Rehab [phone]   On back, knees bent, feet flat, band across thighs, elbows straight but relaxed. Pull hands apart (start). Keeping elbows straight, bring arms up and over head, hands toward floor. Keep pull steady on band. Hold momentarily. Return slowly, keeping pull steady, back to start. Then do same with a wider grip on the band (past shoulder width) Repeat _5-10__ times. Band color __yellow____   Side Pull: Double Arm   On back, knees bent, feet flat. Arms perpendicular to body, shoulder level, elbows straight but relaxed. Pull arms out to sides, elbows straight. Resistance band comes across collarbones, hands toward floor. Hold momentarily. Slowly return to starting position. Repeat _5-10__ times. Band color _yellow____   Sword   On back, knees bent, feet flat, left hand on left hip, right hand above left. Pull right arm DIAGONALLY (hip to shoulder) across chest. Bring right arm along head toward floor. Hold momentarily. Slowly return to starting position. Repeat _5-10__ times. Do with left arm. Band color _yellow_____   Shoulder Rotation: Double Arm   On back, knees bent, feet flat, elbows tucked at sides, bent 90, hands palms up. Pull hands apart and down toward floor, keeping elbows near sides. Hold momentarily. Slowly return to starting position. Repeat _5-10__ times. Band color __yellow____

## 2021-08-24 ENCOUNTER — Encounter: Payer: Self-pay | Admitting: Rehabilitation

## 2021-08-24 ENCOUNTER — Other Ambulatory Visit: Payer: Self-pay

## 2021-08-24 ENCOUNTER — Ambulatory Visit: Payer: BC Managed Care – PPO | Admitting: Rehabilitation

## 2021-08-24 DIAGNOSIS — Z483 Aftercare following surgery for neoplasm: Secondary | ICD-10-CM

## 2021-08-24 DIAGNOSIS — Z170 Estrogen receptor positive status [ER+]: Secondary | ICD-10-CM

## 2021-08-24 DIAGNOSIS — C50411 Malignant neoplasm of upper-outer quadrant of right female breast: Secondary | ICD-10-CM

## 2021-08-24 DIAGNOSIS — R293 Abnormal posture: Secondary | ICD-10-CM

## 2021-08-24 NOTE — Therapy (Signed)
[facility] @ [place], [place], [address] Phone: [phone]   Fax:  [phone]  Physical Therapy Treatment  Patient Details  Name: Ariel Bell MRN: [id number] Date of Birth: 01/06/1966 Referring Provider (PT): Dr. Rolm Bookbinder   Encounter Date: 08/24/2021   PT End of Session - 08/24/21 1658     Visit Number 5    Number of Visits 15    Date for PT Re-Evaluation 09/22/21    PT Start Time 1601    PT Stop Time 8889    PT Time Calculation (min) 53 min    Activity Tolerance Patient tolerated treatment well    Behavior During Therapy [name] for tasks assessed/performed             Past Medical History:  Diagnosis Date   Breast cancer ([place])    Heart murmur    History of radiation therapy 11/19/2020-12/15/2020   Right Breast IMRT; Dr. Gery Pray   Hypertension    Hypothyroidism    Personal history of radiation therapy     Past Surgical History:  Procedure Laterality Date   AXILLARY SENTINEL NODE BIOPSY Right 10/15/2020   Procedure: RIGHT AXILLARY SENTINEL NODE BIOPSY;  Surgeon: Rolm Bookbinder, MD;  Location: [place];  Service: General;  Laterality: Right;   BREAST BIOPSY Right 08/2020   BREAST LUMPECTOMY Right 10/14/2020   BREAST LUMPECTOMY WITH RADIOACTIVE SEED AND SENTINEL LYMPH NODE BIOPSY Right 10/15/2020   Procedure: RIGHT BREAST LUMPECTOMY WITH RADIOACTIVE SEED;  Surgeon: Rolm Bookbinder, MD;  Location: [place];  Service: General;  Laterality: Right;  PEC BLOCK   COLONOSCOPY     WISDOM TOOTH EXTRACTION      There were no vitals filed for this visit.   Subjective Assessment - 08/24/21 1600     Subjective I felt wonderful after I left last last time. 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 She underwent a right lumpectomy and sentinel node biopsy (1 negative node) on  10/15/2020 It is ER/PR positive and HER2 negative with a Ki67 of 10%.    Currently in Pain? No/denies                               [facility] Adult PT Treatment/Exercise - 08/24/21 0001       Neck Exercises: Supine   Neck Retraction 10 reps;5 secs    Other Supine Exercise alternating overhead flexion, alternating reach towards the feet, and protraction/retruaction for postural re-education and relaxation      Shoulder Exercises: Supine   Horizontal ABduction Strengthening;Both;5 reps;Theraband    Theraband Level (Shoulder Horizontal ABduction) Level 1 (Yellow)    External Rotation Strengthening;Both;5 reps;Theraband    Theraband Level (Shoulder External Rotation) Level 1 (Yellow)    Diagonals Strengthening;Right;Left;5 reps;Theraband    Theraband Level (Shoulder Diagonals) Level 1 (Yellow)    Other Supine Exercises supine chest press 2# x 6    Other Supine Exercises tricep extension 2# x 6      Manual Therapy   Soft tissue mobilization to the right pectoralis, axilla and lateral trunk; to bil upper traps and in sidelying to bil lat, paraspinals, and scapular mobilization    Myofascial Release to the Rt axilla towards the incision    Passive ROM to the cspine rotation and sidebending with STM and  to the Rt shoulder during STM and MFR                          PT Long Term Goals - 08/11/21 0859       PT LONG TERM GOAL #1   Title Patient will demonstrate she has regained full shoulder ROM and function post operatively compared to baselines without pull    Time 6    Period Weeks    Status New      PT LONG TERM GOAL #2   Title Pt will report not having bad neck pain at the end of every work day due to improved posture    Time 6    Period Weeks    Status New      PT LONG TERM GOAL #3   Title Pt will be ind with final HEP for continued improved posture    Time 6    Period Weeks    Status New                   Plan - 08/24/21 1658      Clinical Impression Statement Not a tight in the upper traps today more noticeable in the Rt latissimus and lateral trunk.  Still feels challenged by supine shoulder exercises only able to do around 5.    PT Frequency 2x / week    PT Duration 6 weeks    PT Treatment/Interventions ADLs/Self Care Home Management;Therapeutic exercise;Patient/family education;Moist Heat;Manual techniques;Dry needling    PT Next Visit Plan STM/MFR Rt axilla and bil UT, work on progressing reps with supine scap and other postural TE.    Cont every 3 month L-Dex screens for 2 years from SLNB.    Consulted and Agree with Plan of Care Patient             Patient will benefit from skilled therapeutic intervention in order to improve the following deficits and impairments:     Visit Diagnosis: Aftercare following surgery for neoplasm  Abnormal posture  Malignant neoplasm of upper-outer quadrant of right breast in female, estrogen receptor positive [facility])     Problem List Patient Active Problem List   Diagnosis Date Noted   Malignant neoplasm of upper-outer quadrant of right breast in female, estrogen receptor positive ([place]) 10/01/2020    [name], PT 08/24/2021, 5:01 PM  [place] @ [place], [place], [address] Phone: [phone]   Fax:  [phone]  Name: Ariel Bell MRN: [id number] Date of Birth: August 18, 1966

## 2021-08-30 ENCOUNTER — Other Ambulatory Visit: Payer: Self-pay

## 2021-08-30 ENCOUNTER — Ambulatory Visit: Payer: BC Managed Care – PPO

## 2021-08-30 DIAGNOSIS — Z483 Aftercare following surgery for neoplasm: Secondary | ICD-10-CM

## 2021-08-30 DIAGNOSIS — C50411 Malignant neoplasm of upper-outer quadrant of right female breast: Secondary | ICD-10-CM

## 2021-08-30 DIAGNOSIS — R293 Abnormal posture: Secondary | ICD-10-CM

## 2021-08-30 NOTE — Therapy (Signed)
[facility] @ [place], [place], [address] Phone: [phone]   Fax:  [phone]  Physical Therapy Treatment  Patient Details  Name: Ariel Bell MRN: [id number] Date of Birth: 01/04/1966 Referring Provider (PT): Dr. Rolm Bookbinder   Encounter Date: 08/30/2021   PT End of Session - 08/30/21 1606     Visit Number 6    Number of Visits 15    Date for PT Re-Evaluation 09/22/21    PT Start Time 1559    PT Stop Time 1700    PT Time Calculation (min) 61 min    Activity Tolerance Patient tolerated treatment well    Behavior During Therapy [facility] for tasks assessed/performed             Past Medical History:  Diagnosis Date   Breast cancer ([place])    Heart murmur    History of radiation therapy 11/19/2020-12/15/2020   Right Breast IMRT; Dr. Gery Pray   Hypertension    Hypothyroidism    Personal history of radiation therapy     Past Surgical History:  Procedure Laterality Date   AXILLARY SENTINEL NODE BIOPSY Right 10/15/2020   Procedure: RIGHT AXILLARY SENTINEL NODE BIOPSY;  Surgeon: Rolm Bookbinder, MD;  Location: [facility];  Service: General;  Laterality: Right;   BREAST BIOPSY Right 08/2020   BREAST LUMPECTOMY Right 10/14/2020   BREAST LUMPECTOMY WITH RADIOACTIVE SEED AND SENTINEL LYMPH NODE BIOPSY Right 10/15/2020   Procedure: RIGHT BREAST LUMPECTOMY WITH RADIOACTIVE SEED;  Surgeon: Rolm Bookbinder, MD;  Location: [facility];  Service: General;  Laterality: Right;  PEC BLOCK   COLONOSCOPY     WISDOM TOOTH EXTRACTION      There were no vitals filed for this visit.   Subjective Assessment - 08/30/21 1602     Subjective I still have the soreness under my Rt arm (points to axilla) and my upper traps feel tight today. They felt really good after you worked on them the last time I saw you.    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 She underwent a right lumpectomy and sentinel node biopsy (1 negative node) on 10/15/2020 It is ER/PR positive and HER2 negative with a Ki67 of 10%.    Patient Stated Goals learn some strengthening    Currently in Pain? No/denies                               [facility] Adult PT Treatment/Exercise - 08/30/21 0001       Shoulder Exercises: Supine   Horizontal ABduction Strengthening;Both;Other (comment);Theraband   7 reps   Theraband Level (Shoulder Horizontal ABduction) Level 1 (Yellow)    External Rotation Strengthening;Both;Theraband;Other (comment)   7 reps   Theraband Level (Shoulder External Rotation) Level 1 (Yellow)    Flexion Strengthening;Both;Theraband;Other (comment)   Narrow and Wide Grip, x7 each   Theraband Level (Shoulder Flexion) Level 1 (Yellow)    Diagonals Strengthening;Right;Left;Theraband;Other (comment)   7 reps each   Theraband Level (Shoulder Diagonals) Level 1 (Yellow)    Other Supine Exercises supine chest press 2# x 10    Other Supine Exercises tricep extension 2# x 10      Manual Therapy   Soft tissue mobilization In supine to the right pectoralis, axilla and lateral trunk and bil upper traps; then in Lt S/L to Rt latissimus  Myofascial Release to the Rt axilla towards the incision and along pectoralis insertion    Passive ROM to the cspine rotation and sidebending with STM and to the Rt shoulder during STM and MFR                          PT Long Term Goals - 08/11/21 0859       PT LONG TERM GOAL #1   Title Patient will demonstrate she has regained full shoulder ROM and function post operatively compared to baselines without pull    Time 6    Period Weeks    Status New      PT LONG TERM GOAL #2   Title Pt will report not having bad neck pain at the end of every work day due to improved posture    Time 6    Period Weeks    Status New      PT LONG TERM GOAL #3   Title Pt will be ind with final  HEP for continued improved posture    Time 6    Period Weeks    Status New                   Plan - 08/30/21 1607     Clinical Impression Statement Pt was able to progress with supine scapular series today to 7 reps of supine scapular series, then 10 reps of weighted exs reporting feeling the muscles work with each exercise, but no pain. Then continued with manual therapy working to decrease Rt latissimus and lateral trunk and included bil upper traps today as pt reports this was tighter from being at the end of her work day.    Personal Factors and Comorbidities Comorbidity 3+;Fitness    Comorbidities desk job, poor posture, hx of radiation and SLNB    Examination-Activity Limitations Lift    Examination-Participation Restrictions Occupation    Stability/Clinical Decision Making Stable/Uncomplicated    Rehab Potential Excellent    PT Frequency 2x / week    PT Duration 6 weeks    PT Treatment/Interventions ADLs/Self Care Home Management;Therapeutic exercise;Patient/family education;Moist Heat;Manual techniques;Dry needling    PT Next Visit Plan STM/MFR Rt axilla and bil UT, work on progressing reps with supine scap and other postural TE.    Cont every 3 month L-Dex screens for 2 years from SLNB.    Consulted and Agree with Plan of Care Patient             Patient will benefit from skilled therapeutic intervention in order to improve the following deficits and impairments:  Postural dysfunction, Decreased range of motion, Decreased knowledge of precautions, Pain, Decreased scar mobility, Decreased strength  Visit Diagnosis: Aftercare following surgery for neoplasm  Abnormal posture  Malignant neoplasm of upper-outer quadrant of right breast in female, estrogen receptor positive [facility])     Problem List Patient Active Problem List   Diagnosis Date Noted   Malignant neoplasm of upper-outer quadrant of right breast in female, estrogen receptor positive ([place]) 10/01/2020     Otelia Limes, PTA 08/30/2021, 5:09 PM  [facility] @ [place], [place], [address] Phone: [phone]   Fax:  [phone]  Name: TRACEY HERMANCE MRN: [id number] Date of Birth: September 24, 1966

## 2021-09-07 ENCOUNTER — Ambulatory Visit: Payer: BC Managed Care – PPO | Attending: Oncology | Admitting: Rehabilitation

## 2021-09-07 ENCOUNTER — Encounter: Payer: Self-pay | Admitting: Rehabilitation

## 2021-09-07 ENCOUNTER — Other Ambulatory Visit: Payer: Self-pay

## 2021-09-07 DIAGNOSIS — Z483 Aftercare following surgery for neoplasm: Secondary | ICD-10-CM | POA: Diagnosis present

## 2021-09-07 DIAGNOSIS — R293 Abnormal posture: Secondary | ICD-10-CM | POA: Diagnosis present

## 2021-09-07 DIAGNOSIS — Z170 Estrogen receptor positive status [ER+]: Secondary | ICD-10-CM | POA: Diagnosis present

## 2021-09-07 DIAGNOSIS — C50411 Malignant neoplasm of upper-outer quadrant of right female breast: Secondary | ICD-10-CM | POA: Diagnosis present

## 2021-09-07 NOTE — Therapy (Signed)
[facility] @ [place], [place], [address] Phone: [phone]   Fax:  [phone]  Physical Therapy Treatment  Patient Details  Name: Ariel Bell MRN: [id number] Date of Birth: Mar 27, 1966 Referring Provider (PT): Dr. Rolm Bookbinder   Encounter Date: 09/07/2021   PT End of Session - 09/07/21 1702     Visit Number 7    Number of Visits 15    Date for PT Re-Evaluation 09/22/21    PT Start Time 1600    PT Stop Time 6387    PT Time Calculation (min) 50 min    Activity Tolerance Patient tolerated treatment well    Behavior During Therapy [facility] for tasks assessed/performed             Past Medical History:  Diagnosis Date   Breast cancer ([place])    Heart murmur    History of radiation therapy 11/19/2020-12/15/2020   Right Breast IMRT; Dr. Gery Pray   Hypertension    Hypothyroidism    Personal history of radiation therapy     Past Surgical History:  Procedure Laterality Date   AXILLARY SENTINEL NODE BIOPSY Right 10/15/2020   Procedure: RIGHT AXILLARY SENTINEL NODE BIOPSY;  Surgeon: Rolm Bookbinder, MD;  Location: [facility];  Service: General;  Laterality: Right;   BREAST BIOPSY Right 08/2020   BREAST LUMPECTOMY Right 10/14/2020   BREAST LUMPECTOMY WITH RADIOACTIVE SEED AND SENTINEL LYMPH NODE BIOPSY Right 10/15/2020   Procedure: RIGHT BREAST LUMPECTOMY WITH RADIOACTIVE SEED;  Surgeon: Rolm Bookbinder, MD;  Location: [facility];  Service: General;  Laterality: Right;  PEC BLOCK   COLONOSCOPY     WISDOM TOOTH EXTRACTION      There were no vitals filed for this visit.   Subjective Assessment - 09/07/21 1601     Subjective Its starting to feel a little better    Pertinent History Patient was diagnosed on 09/02/2020 with 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 She underwent a right lumpectomy and sentinel node biopsy (1 negative node) on 10/15/2020 It is ER/PR positive and HER2  negative with a Ki67 of 10%.    Currently in Pain? No/denies                               [facility] Adult PT Treatment/Exercise - 09/07/21 0001       Exercises   Exercises Shoulder      Shoulder Exercises: Standing   External Rotation Both;10 reps    Theraband Level (Shoulder External Rotation) Level 1 (Yellow)    External Rotation Limitations with back against wall.  Mod vcs to keep correct position    Extension Both;10 reps    Theraband Level (Shoulder Extension) Level 1 (Yellow)    Extension Limitations too hard with cable machine    Row Both;10 reps    Row Limitations cable machine 3#    Other Standing Exercises bicep curls 2# x 10bil, scaption 2# x 10bil,      Shoulder Exercises: Pulleys   Flexion 2 minutes    Flexion Limitations with initial instruction    ABduction 2 minutes    ABduction Limitations with initial instruction      Shoulder Exercises: Therapy Ball   Flexion 5 reps    Flexion Limitations with initial cueing      Manual Therapy   Soft tissue mobilization In supine to the right pectoralis, axilla and lateral trunk  and bil upper traps; then in Lt S/L to Rt latissimus    Myofascial Release to the Rt axilla towards the incision and along pectoralis insertion    Passive ROM into cervical sidebending with stretch to the left only today                          PT Long Term Goals - 08/11/21 0859       PT LONG TERM GOAL #1   Title Patient will demonstrate she has regained full shoulder ROM and function post operatively compared to baselines without pull    Time 6    Period Weeks    Status New      PT LONG TERM GOAL #2   Title Pt will report not having bad neck pain at the end of every work day due to improved posture    Time 6    Period Weeks    Status New      PT LONG TERM GOAL #3   Title Pt will be ind with final HEP for continued improved posture    Time 6    Period Weeks    Status New                    Plan - 09/07/21 1703     Clinical Impression Statement started more standing strenght ABC type TE today tolerated well and able to do at least 10 reps of each.  Only difficult one was extension attempted with the cable machine.  Okay with theraband.  Less tension overall today / need for STM.  Pt is improving tightness and mobility.    PT Frequency 2x / week    PT Duration 6 weeks    PT Treatment/Interventions ADLs/Self Care Home Management;Therapeutic exercise;Patient/family education;Moist Heat;Manual techniques;Dry needling    PT Next Visit Plan STM/MFR Rt axilla and bil UT, work on progressing reps with supine scap and other postural TE.    Cont every 3 month L-Dex screens for 2 years from SLNB.    Consulted and Agree with Plan of Care Patient             Patient will benefit from skilled therapeutic intervention in order to improve the following deficits and impairments:     Visit Diagnosis: Abnormal posture  Aftercare following surgery for neoplasm  Malignant neoplasm of upper-outer quadrant of right breast in female, estrogen receptor positive [facility])     Problem List Patient Active Problem List   Diagnosis Date Noted   Malignant neoplasm of upper-outer quadrant of right breast in female, estrogen receptor positive ([place]) 10/01/2020    [name], PT 09/07/2021, 5:06 PM  [facility] @ [place], [place], [address] Phone: [phone]   Fax:  [phone]  Name: TRENT THEISEN MRN: [id number] Date of Birth: [birth year]

## 2021-09-14 ENCOUNTER — Ambulatory Visit: Payer: BC Managed Care – PPO | Admitting: Rehabilitation

## 2021-09-14 ENCOUNTER — Encounter: Payer: Self-pay | Admitting: Rehabilitation

## 2021-09-14 ENCOUNTER — Other Ambulatory Visit: Payer: Self-pay

## 2021-09-14 DIAGNOSIS — R293 Abnormal posture: Secondary | ICD-10-CM

## 2021-09-14 DIAGNOSIS — C50411 Malignant neoplasm of upper-outer quadrant of right female breast: Secondary | ICD-10-CM

## 2021-09-14 DIAGNOSIS — Z483 Aftercare following surgery for neoplasm: Secondary | ICD-10-CM

## 2021-09-14 NOTE — Therapy (Signed)
[facility] @ [place], [place], [address] Phone: [phone]   Fax:  [phone]  Physical Therapy Treatment  Patient Details  Name: NEVENA ROZENBERG MRN: [id number] Date of Birth: 1966/08/05 Referring Provider (PT): Dr. Rolm Bookbinder   Encounter Date: 09/14/2021   PT End of Session - 09/14/21 1651     Visit Number 8    Number of Visits 15    Date for PT Re-Evaluation 09/22/21    PT Start Time 6415    PT Stop Time 8309    PT Time Calculation (min) 54 min    Activity Tolerance Patient tolerated treatment well    Behavior During Therapy [facility] for tasks assessed/performed             Past Medical History:  Diagnosis Date   Breast cancer ([place])    Heart murmur    History of radiation therapy 11/19/2020-12/15/2020   Right Breast IMRT; Dr. Gery Pray   Hypertension    Hypothyroidism    Personal history of radiation therapy     Past Surgical History:  Procedure Laterality Date   AXILLARY SENTINEL NODE BIOPSY Right 10/15/2020   Procedure: RIGHT AXILLARY SENTINEL NODE BIOPSY;  Surgeon: Rolm Bookbinder, MD;  Location: [facility];  Service: General;  Laterality: Right;   BREAST BIOPSY Right 08/2020   BREAST LUMPECTOMY Right 10/14/2020   BREAST LUMPECTOMY WITH RADIOACTIVE SEED AND SENTINEL LYMPH NODE BIOPSY Right 10/15/2020   Procedure: RIGHT BREAST LUMPECTOMY WITH RADIOACTIVE SEED;  Surgeon: Rolm Bookbinder, MD;  Location: [facility];  Service: General;  Laterality: Right;  PEC BLOCK   COLONOSCOPY     WISDOM TOOTH EXTRACTION      There were no vitals filed for this visit.   Subjective Assessment - 09/14/21 1551     Subjective still feeling better and better    Pertinent History Patient was diagnosed on 09/02/2020 with 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 She underwent a right lumpectomy and sentinel node biopsy (1 negative node) on 10/15/2020 It is ER/PR positive and HER2  negative with a Ki67 of 10%.    Currently in Pain? No/denies                [facility] PT Assessment - 09/14/21 0001       AROM   Right Shoulder Flexion 155 Degrees   slight pull at incision   Right Shoulder ABduction 170 Degrees   no pain today                          [facility] Adult PT Treatment/Exercise - 09/14/21 0001       Shoulder Exercises: Supine   Horizontal ABduction Both;15 reps    Theraband Level (Shoulder Horizontal ABduction) Level 1 (Yellow)      Shoulder Exercises: Standing   Row Both;10 reps    Row Limitations cable machine 3#    Other Standing Exercises bicep curls 2# x 10bil, scaption 2# x 10bil,      Shoulder Exercises: Pulleys   Flexion 2 minutes    ABduction 2 minutes      Shoulder Exercises: Therapy Ball   Flexion 10 reps      Manual Therapy   Soft tissue mobilization In supine to the right pectoralis, axilla and lateral trunk and bil upper traps; then in Lt S/L to Rt latissimus, included more inferior lateral breast due to firmness here    Myofascial  Release to the Rt axilla towards the incision and along pectoralis insertion                          PT Long Term Goals - 09/14/21 1654       PT LONG TERM GOAL #1   Title Patient will demonstrate she has regained full shoulder ROM and function post operatively compared to baselines without pull    Status Partially Met      PT LONG TERM GOAL #2   Title Pt will report not having bad neck pain at the end of every work day due to improved posture    Status On-going      PT LONG TERM GOAL #3   Title Pt will be ind with final HEP for continued improved posture    Status On-going                   Plan - 09/14/21 1652     Clinical Impression Statement Pt is improving AROM with less axillary pull. Now only mild with overhead flexion and nothing noted during abduction.  Pt is also improving strength with tolerance to increasing reps and exercises.  Pt does  have some mild puffiness Rt lateral trunk and axilla, firmness lateral trunk from lateral/inferior breast, and some adhesions into incision with PROM.  Pt will extend POC 1x per week x 4 weeks.    PT Frequency 2x / week    PT Duration 6 weeks    PT Treatment/Interventions ADLs/Self Care Home Management;Therapeutic exercise;Patient/family education;Moist Heat;Manual techniques;Dry needling    PT Next Visit Plan STM/MFR Rt axilla and bil UT, work on progressing reps with supine scap and other postural TE.    Cont every 3 month L-Dex screens for 2 years from SLNB.    Consulted and Agree with Plan of Care Patient             Patient will benefit from skilled therapeutic intervention in order to improve the following deficits and impairments:     Visit Diagnosis: Abnormal posture  Aftercare following surgery for neoplasm  Malignant neoplasm of upper-outer quadrant of right breast in female, estrogen receptor positive [facility])     Problem List Patient Active Problem List   Diagnosis Date Noted   Malignant neoplasm of upper-outer quadrant of right breast in female, estrogen receptor positive ([place]) 10/01/2020    [name], PT 09/14/2021, 4:55 PM  [facility] @ [place], [place], [address] Phone: [phone]   Fax:  [phone]  Name: XIANNA SIVERLING MRN: [id number] Date of Birth: 12/15/1965

## 2021-09-20 ENCOUNTER — Ambulatory Visit: Payer: BC Managed Care – PPO

## 2021-09-20 ENCOUNTER — Other Ambulatory Visit: Payer: Self-pay

## 2021-09-20 DIAGNOSIS — R293 Abnormal posture: Secondary | ICD-10-CM

## 2021-09-20 DIAGNOSIS — C50411 Malignant neoplasm of upper-outer quadrant of right female breast: Secondary | ICD-10-CM

## 2021-09-20 DIAGNOSIS — Z170 Estrogen receptor positive status [ER+]: Secondary | ICD-10-CM

## 2021-09-20 DIAGNOSIS — Z483 Aftercare following surgery for neoplasm: Secondary | ICD-10-CM

## 2021-09-20 NOTE — Therapy (Signed)
[facility] @ [place], [place], [address] Phone: [phone]   Fax:  [phone]  Physical Therapy Treatment  Patient Details  Name: Ariel Bell MRN: [id number] Date of Birth: [birth year] Referring Provider (PT): Dr. Rolm Bookbinder   Encounter Date: 09/20/2021   PT End of Session - 09/20/21 1624     Visit Number 9    Number of Visits 15    Date for PT Re-Evaluation 09/22/21    PT Start Time 7253    PT Stop Time 1703    PT Time Calculation (min) 54 min    Activity Tolerance Patient tolerated treatment well    Behavior During Therapy [facility] for tasks assessed/performed             Past Medical History:  Diagnosis Date   Breast cancer ([place])    Heart murmur    History of radiation therapy 11/19/2020-12/15/2020   Right Breast IMRT; Dr. Gery Pray   Hypertension    Hypothyroidism    Personal history of radiation therapy     Past Surgical History:  Procedure Laterality Date   AXILLARY SENTINEL NODE BIOPSY Right 10/15/2020   Procedure: RIGHT AXILLARY SENTINEL NODE BIOPSY;  Surgeon: Rolm Bookbinder, MD;  Location: [facility];  Service: General;  Laterality: Right;   BREAST BIOPSY Right 08/2020   BREAST LUMPECTOMY Right 10/14/2020   BREAST LUMPECTOMY WITH RADIOACTIVE SEED AND SENTINEL LYMPH NODE BIOPSY Right 10/15/2020   Procedure: RIGHT BREAST LUMPECTOMY WITH RADIOACTIVE SEED;  Surgeon: Rolm Bookbinder, MD;  Location: [facility];  Service: General;  Laterality: Right;  PEC BLOCK   COLONOSCOPY     WISDOM TOOTH EXTRACTION      There were no vitals filed for this visit.   Subjective Assessment - 09/20/21 1610     Subjective I can tell I'm getting better every week with physical therapy. The tightness across the back of my neck and upper traps is much improved since I startd back at work. And I still feel the pull in my Rt axilla but it's so much better than it was and more tolerable.    Pertinent  History Patient was diagnosed on 09/02/2020 with 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 She underwent a right lumpectomy and sentinel node biopsy (1 negative node) on 10/15/2020 It is ER/PR positive and HER2 negative with a Ki67 of 10%.    Patient Stated Goals learn some strengthening    Currently in Pain? No/denies                               [facility] Adult PT Treatment/Exercise - 09/20/21 0001       Shoulder Exercises: Supine   Horizontal ABduction Strengthening;Both;15 reps;Theraband    Theraband Level (Shoulder Horizontal ABduction) Level 2 (Red)    External Rotation Strengthening;Both;12 reps;Theraband   placed folded pillowcases at elbows to prevent UE abd   Theraband Level (Shoulder External Rotation) Level 2 (Red)    Flexion Strengthening;Both;10 reps;Theraband    Theraband Level (Shoulder Flexion) Level 2 (Red)    Diagonals Strengthening;Right;Left;10 reps;Theraband    Theraband Level (Shoulder Diagonals) Level 2 (Red)      Manual Therapy   Soft tissue mobilization In supine to the right pectoralis, axilla and then bil upper traps; also to inferior breast where tightness palpable    Myofascial Release to the Rt axilla towards the incision and along pectoralis insertion  Passive ROM into cervical sidebending and rotation with stretch with clavicular depression for increased stretch to the left only today                          PT Long Term Goals - 09/14/21 1654       PT LONG TERM GOAL #1   Title Patient will demonstrate she has regained full shoulder ROM and function post operatively compared to baselines without pull    Status Partially Met      PT LONG TERM GOAL #2   Title Pt will report not having bad neck pain at the end of every work day due to improved posture    Status On-going      PT LONG TERM GOAL #3   Title Pt will be ind with final HEP for continued improved posture    Status On-going                    Plan - 09/20/21 1624     Clinical Impression Statement Pt is progressing very well reporting reductions in pain and tightness each week. Was able to progress supine scapular series with red theraband which she tolerated well but did have to reduce some reps due to muscle fatigue. Then continued with manual therapy working to further reduce muscular tightness, especially at Rt pectoralis insertion. this softened well and by end of session pt reports overall feelng looser.    Personal Factors and Comorbidities Comorbidity 3+;Fitness    Comorbidities desk job, poor posture, hx of radiation and SLNB    Examination-Activity Limitations Lift    Examination-Participation Restrictions Occupation    Stability/Clinical Decision Making Stable/Uncomplicated    Rehab Potential Excellent    PT Frequency 2x / week    PT Duration 6 weeks    PT Treatment/Interventions ADLs/Self Care Home Management;Therapeutic exercise;Patient/family education;Moist Heat;Manual techniques;Dry needling    PT Next Visit Plan Renewal next session, reduce freq to 1x/wk, x4 more weeks. STM/MFR Rt axilla and bil UT, work on progressing reps with supine scap and other postural TE (bil UE 3 way raises).    Cont every 3 month L-Dex screens for 2 years from SLNB (~10/15/22)    PT Home Exercise Plan Post op shoulder ROM HEP, [id number], posture at work; cervical retraction and supine scapular series    Consulted and Agree with Plan of Care Patient             Patient will benefit from skilled therapeutic intervention in order to improve the following deficits and impairments:  Postural dysfunction, Decreased range of motion, Decreased knowledge of precautions, Pain, Decreased scar mobility, Decreased strength  Visit Diagnosis: Abnormal posture  Aftercare following surgery for neoplasm  Malignant neoplasm of upper-outer quadrant of right breast in female, estrogen receptor positive [facility])     Problem  List Patient Active Problem List   Diagnosis Date Noted   Malignant neoplasm of upper-outer quadrant of right breast in female, estrogen receptor positive ([place]) 10/01/2020    Otelia Limes, PTA 09/20/2021, 5:09 PM  [facility] @ [place], [place], [address] Phone: [phone]   Fax:  [phone]  Name: CAMILAH SPILLMAN MRN: [id number] Date of Birth: [birth year]

## 2021-09-30 ENCOUNTER — Ambulatory Visit: Payer: BC Managed Care – PPO | Attending: Oncology

## 2021-09-30 ENCOUNTER — Other Ambulatory Visit: Payer: Self-pay

## 2021-09-30 DIAGNOSIS — C50411 Malignant neoplasm of upper-outer quadrant of right female breast: Secondary | ICD-10-CM | POA: Diagnosis present

## 2021-09-30 DIAGNOSIS — R293 Abnormal posture: Secondary | ICD-10-CM | POA: Diagnosis not present

## 2021-09-30 DIAGNOSIS — Z483 Aftercare following surgery for neoplasm: Secondary | ICD-10-CM | POA: Diagnosis present

## 2021-09-30 DIAGNOSIS — Z170 Estrogen receptor positive status [ER+]: Secondary | ICD-10-CM | POA: Insufficient documentation

## 2021-09-30 NOTE — Therapy (Signed)
[place] @ [facility] [place], [place], [address] Phone: [phone]   Fax:  [phone]  Physical Therapy Treatment  Patient Details  Name: Ariel Bell MRN: [id number] Date of Birth: 1966/04/04 Referring Provider (PT): Dr. Rolm Bookbinder   Encounter Date: 09/30/2021   PT End of Session - 09/30/21 1011     Visit Number 10    Number of Visits 19    Date for PT Re-Evaluation 10/28/21    PT Start Time 1005    PT Stop Time 1100    PT Time Calculation (min) 55 min    Activity Tolerance Patient tolerated treatment well    Behavior During Therapy [facility] for tasks assessed/performed             Past Medical History:  Diagnosis Date   Breast cancer ([place])    Heart murmur    History of radiation therapy 11/19/2020-12/15/2020   Right Breast IMRT; Dr. Gery Pray   Hypertension    Hypothyroidism    Personal history of radiation therapy     Past Surgical History:  Procedure Laterality Date   AXILLARY SENTINEL NODE BIOPSY Right 10/15/2020   Procedure: RIGHT AXILLARY SENTINEL NODE BIOPSY;  Surgeon: Rolm Bookbinder, MD;  Location: [facility];  Service: General;  Laterality: Right;   BREAST BIOPSY Right 08/2020   BREAST LUMPECTOMY Right 10/14/2020   BREAST LUMPECTOMY WITH RADIOACTIVE SEED AND SENTINEL LYMPH NODE BIOPSY Right 10/15/2020   Procedure: RIGHT BREAST LUMPECTOMY WITH RADIOACTIVE SEED;  Surgeon: Rolm Bookbinder, MD;  Location: [facility];  Service: General;  Laterality: Right;  PEC BLOCK   COLONOSCOPY     WISDOM TOOTH EXTRACTION      There were no vitals filed for this visit.   Subjective Assessment - 09/30/21 1007     Subjective I've had some increased soreness across my shoulders. I've been sleeping with a heating pad. The pull in my Rt axilla is getting better.    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 She underwent a  right lumpectomy and sentinel node biopsy (1 negative node) on 10/15/2020 It is ER/PR positive and HER2 negative with a Ki67 of 10%.    Patient Stated Goals learn some strengthening    Currently in Pain? No/denies                               [facility] Adult PT Treatment/Exercise - 09/30/21 0001       Manual Therapy   Soft tissue mobilization In supine to the right pectoralis, axilla, and inferior to breast where tightness still palpable but less so; then with cocoa butter to bil upper traps and Lt SCM    Myofascial Release to the Rt axilla towards the incision and along pectoralis insertion    Passive ROM into cervical sidebending and rotation with stretch with clavicular depression for increased stretch                          PT Long Term Goals - 09/30/21 1009       PT LONG TERM GOAL #1   Title Patient will demonstrate she has regained full shoulder ROM and function post operatively compared to baselines without pull    Baseline Did not remeasure today but P/ROM near full - 09/30/21    Status Partially Met  PT LONG TERM GOAL #2   Title Pt will report 75% improvement of neck pain at the end of every work day due to improved posture    Baseline Pt reports at least 50% improved at this time - 09/30/21    Status On-going      PT LONG TERM GOAL #3   Title Pt will be ind with final HEP for continued improved posture    Baseline Pt is independent with current HEP - 09/30/21    Status On-going                   Plan - 09/30/21 1058     Clinical Impression Statement Pt reports that overall her Rt axillary and inferior breast tightness, though still present, has greatly improved. Continued with manual therapy here as well as cervical area which is also improving. Pt continued to note great reduction of tightness at end of session, especially in neck. Renewal done this session. Reducing freq to 1x/wk for up to 4 more weeks to help further  reduce pts tightness at end of work day and finalize HEP.    Personal Factors and Comorbidities Comorbidity 3+;Fitness    Comorbidities desk job, poor posture, hx of radiation and SLNB    Examination-Activity Limitations Lift    Examination-Participation Restrictions Occupation    Stability/Clinical Decision Making Stable/Uncomplicated    Rehab Potential Excellent    PT Frequency 1x / week    PT Duration 4 weeks    PT Treatment/Interventions ADLs/Self Care Home Management;Therapeutic exercise;Patient/family education;Moist Heat;Manual techniques;Dry needling    PT Next Visit Plan Renewal done this session, reducing freq to 1x/wk, x4 more weeks. Cont STM/MFR Rt axilla and bil UT, work on progressing reps with supine scap and other postural TE (bil UE 3 way raises).    Cont every 3 month L-Dex screens for 2 years from SLNB (~10/15/22)    PT Home Exercise Plan Post op shoulder ROM HEP, [id number], posture at work; cervical retraction and supine scapular series    Consulted and Agree with Plan of Care Patient             Patient will benefit from skilled therapeutic intervention in order to improve the following deficits and impairments:  Postural dysfunction, Decreased range of motion, Decreased knowledge of precautions, Pain, Decreased scar mobility, Decreased strength  Visit Diagnosis: Abnormal posture  Aftercare following surgery for neoplasm  Malignant neoplasm of upper-outer quadrant of right breast in female, estrogen receptor positive [facility])     Problem List Patient Active Problem List   Diagnosis Date Noted   Malignant neoplasm of upper-outer quadrant of right breast in female, estrogen receptor positive ([place]) 10/01/2020    Otelia Limes, PTA 09/30/2021, 11:04 AM  [place] @ [place], [place], [address] Phone: [phone]   Fax:  [phone]  Name: Ariel Bell MRN: [id number] Date of  Birth: 1965-11-22

## 2021-09-30 NOTE — Addendum Note (Signed)
Addended by: Shan Levans R on: 09/30/2021 12:48 PM   Modules accepted: Orders

## 2021-10-05 ENCOUNTER — Ambulatory Visit: Payer: BC Managed Care – PPO | Admitting: Rehabilitation

## 2021-10-05 ENCOUNTER — Other Ambulatory Visit: Payer: Self-pay

## 2021-10-05 ENCOUNTER — Encounter: Payer: Self-pay | Admitting: Rehabilitation

## 2021-10-05 DIAGNOSIS — Z483 Aftercare following surgery for neoplasm: Secondary | ICD-10-CM

## 2021-10-05 DIAGNOSIS — R293 Abnormal posture: Secondary | ICD-10-CM | POA: Diagnosis not present

## 2021-10-05 NOTE — Therapy (Signed)
[place] @ [place], [place], [address] Phone: [phone]   Fax:  [phone]  Physical Therapy Treatment  Patient Details  Name: BRISIA SCHUERMANN MRN: [id number] Date of Birth: 1966/09/10 Referring Provider (PT): Dr. Rolm Bookbinder   Encounter Date: 10/05/2021   PT End of Session - 10/05/21 1548     Visit Number 11    Number of Visits 19    Date for PT Re-Evaluation 10/28/21    PT Start Time 1500    PT Stop Time 4765    PT Time Calculation (min) 48 min    Activity Tolerance Patient tolerated treatment well    Behavior During Therapy [facility] for tasks assessed/performed             Past Medical History:  Diagnosis Date   Breast cancer ([place])    Heart murmur    History of radiation therapy 11/19/2020-12/15/2020   Right Breast IMRT; Dr. Gery Pray   Hypertension    Hypothyroidism    Personal history of radiation therapy     Past Surgical History:  Procedure Laterality Date   AXILLARY SENTINEL NODE BIOPSY Right 10/15/2020   Procedure: RIGHT AXILLARY SENTINEL NODE BIOPSY;  Surgeon: Rolm Bookbinder, MD;  Location: [facility];  Service: General;  Laterality: Right;   BREAST BIOPSY Right 08/2020   BREAST LUMPECTOMY Right 10/14/2020   BREAST LUMPECTOMY WITH RADIOACTIVE SEED AND SENTINEL LYMPH NODE BIOPSY Right 10/15/2020   Procedure: RIGHT BREAST LUMPECTOMY WITH RADIOACTIVE SEED;  Surgeon: Rolm Bookbinder, MD;  Location: [facility];  Service: General;  Laterality: Right;  PEC BLOCK   COLONOSCOPY     WISDOM TOOTH EXTRACTION      There were no vitals filed for this visit.   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 She underwent a right lumpectomy and sentinel node  biopsy (1 negative node) on 10/15/2020 It is ER/PR positive and HER2 negative with a Ki67 of 10%.    Currently in Pain? No/denies                               [facility] Adult PT Treatment/Exercise - 10/05/21 0001       Neck Exercises: Supine   Neck Retraction 10 reps;5 secs      Shoulder Exercises: Supine   Protraction Both;10 reps    Protraction Limitations bil - attempted with weight but needed no weight for cueing and performance      Shoulder Exercises: Standing   Extension Limitations cable machine 3# x 10 bil with cueing for intial performance    Row Limitations cable machine 3# x 10    Other Standing Exercises bicep curls 2# x 15bil, scaption 2# x 10bil,      Shoulder Exercises: Pulleys   Flexion 2 minutes    ABduction 2 minutes      Shoulder Exercises: Therapy Ball   Flexion 10 reps      Manual Therapy   Manual therapy comments made pt long skinny rectangle chip pack for the inferior breast    Soft tissue mobilization In supine to the right pectoralis, axilla, and inferior to breast where tightness still palpable but less so; then with cocoa  butter to bil upper traps and Lt SCM    Myofascial Release to the Rt axilla towards the incision and along pectoralis insertion    Passive ROM into cervical sidebending and rotation with stretch with clavicular depression for increased stretch                          PT Long Term Goals - 09/30/21 1009       PT LONG TERM GOAL #1   Title Patient will demonstrate she has regained full shoulder ROM and function post operatively compared to baselines without pull    Baseline Did not remeasure today but P/ROM near full - 09/30/21    Status Partially Met      PT LONG TERM GOAL #2   Title Pt will report 75% improvement of neck pain at the end of every work day due to improved posture    Baseline Pt reports at least 50% improved at this time - 09/30/21    Status On-going      PT LONG TERM GOAL #3    Title Pt will be ind with final HEP for continued improved posture    Baseline Pt is independent with current HEP - 09/30/21    Status On-going                   Plan - 10/05/21 1548     Clinical Impression Statement Pt doing well with a small amount of inferior breast pain and pull.  Gave pt small chip pack to start to try to work on this last spot.    PT Frequency 1x / week    PT Duration 4 weeks    PT Treatment/Interventions ADLs/Self Care Home Management;Therapeutic exercise;Patient/family education;Moist Heat;Manual techniques;Dry needling    PT Next Visit Plan * Do SOZO on 10/20/21 and schedule out*  Cont STM/MFR Rt axilla and bil UT, work on progressing reps with supine scap and other postural TE (bil UE 3 way raises).    Consulted and Agree with Plan of Care Patient             Patient will benefit from skilled therapeutic intervention in order to improve the following deficits and impairments:     Visit Diagnosis: Aftercare following surgery for neoplasm     Problem List Patient Active Problem List   Diagnosis Date Noted   Malignant neoplasm of upper-outer quadrant of right breast in female, estrogen receptor positive ([place]) 10/01/2020    [name], PT 10/05/2021, 3:51 PM  [facility] @ [place], [place], [address] Phone: [phone]   Fax:  [phone]  Name: LAURIEANN FRIDDLE MRN: [id number] Date of Birth: 1966-10-24

## 2021-10-13 ENCOUNTER — Encounter: Payer: Self-pay | Admitting: Rehabilitation

## 2021-10-13 ENCOUNTER — Ambulatory Visit: Payer: BC Managed Care – PPO | Admitting: Rehabilitation

## 2021-10-13 ENCOUNTER — Other Ambulatory Visit: Payer: Self-pay

## 2021-10-13 DIAGNOSIS — C50411 Malignant neoplasm of upper-outer quadrant of right female breast: Secondary | ICD-10-CM

## 2021-10-13 DIAGNOSIS — R293 Abnormal posture: Secondary | ICD-10-CM | POA: Diagnosis not present

## 2021-10-13 DIAGNOSIS — Z483 Aftercare following surgery for neoplasm: Secondary | ICD-10-CM

## 2021-10-13 DIAGNOSIS — Z170 Estrogen receptor positive status [ER+]: Secondary | ICD-10-CM

## 2021-10-13 NOTE — Therapy (Signed)
[place] [facility] @ [place], [place], [address] Phone: [phone]   Fax:  [phone]  Physical Therapy Treatment  Patient Details  Name: Ariel Bell MRN: [id number] Date of Birth: [birth year] Referring Provider (PT): Dr. Rolm Bookbinder   Encounter Date: 10/13/2021   PT End of Session - 10/13/21 1648     Visit Number 12    Number of Visits 19    Date for PT Re-Evaluation 10/28/21    PT Start Time 1600    PT Stop Time 1648    PT Time Calculation (min) 48 min    Activity Tolerance Patient tolerated treatment well    Behavior During Therapy [facility] for tasks assessed/performed             Past Medical History:  Diagnosis Date   Breast cancer ([place])    Heart murmur    History of radiation therapy 11/19/2020-12/15/2020   Right Breast IMRT; Dr. Gery Pray   Hypertension    Hypothyroidism    Personal history of radiation therapy     Past Surgical History:  Procedure Laterality Date   AXILLARY SENTINEL NODE BIOPSY Right 10/15/2020   Procedure: RIGHT AXILLARY SENTINEL NODE BIOPSY;  Surgeon: Rolm Bookbinder, MD;  Location: [facility];  Service: General;  Laterality: Right;   BREAST BIOPSY Right 08/2020   BREAST LUMPECTOMY Right 10/14/2020   BREAST LUMPECTOMY WITH RADIOACTIVE SEED AND SENTINEL LYMPH NODE BIOPSY Right 10/15/2020   Procedure: RIGHT BREAST LUMPECTOMY WITH RADIOACTIVE SEED;  Surgeon: Rolm Bookbinder, MD;  Location: [facility];  Service: General;  Laterality: Right;  PEC BLOCK   COLONOSCOPY     WISDOM TOOTH EXTRACTION      There were no vitals filed for this visit.   Subjective Assessment - 10/13/21 1605     Subjective I am doing much better.  Even with sleeping.  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 She underwent a right lumpectomy and sentinel node biopsy (1  negative node) on 10/15/2020 It is ER/PR positive and HER2 negative with a Ki67 of 10%.    Currently in Pain? No/denies                               [facility] Adult PT Treatment/Exercise - 10/13/21 0001       Neck Exercises: Supine   Neck Retraction 10 reps;5 secs      Shoulder Exercises: Supine   Protraction Both;10 reps    Protraction Limitations much better scapular isolation today.  tcs not needed      Shoulder Exercises: Standing   Extension Limitations cable machine 3 # 2 x 10 bil with cueing for intial performance    Row Limitations cable machine 3#  2x10    Other Standing Exercises bicep curls 2# 2x 10bil, scaption 2#  x10bil, flexion 2# x10      Shoulder Exercises: Pulleys   Flexion 2 minutes    ABduction 2 minutes      Shoulder Exercises: Therapy Ball   Flexion 10 reps      Manual Therapy   Soft tissue mobilization In supine to the right pectoralis, axilla, and inferior to breast where tightness still palpable but less so; then with cocoa butter to bil upper traps and Lt SCM    Myofascial  Release to the Rt axilla towards the incision and along pectoralis insertion    Passive ROM into cervical sidebending and rotation with stretch with clavicular depression for increased stretch                          PT Long Term Goals - 09/30/21 1009       PT LONG TERM GOAL #1   Title Patient will demonstrate she has regained full shoulder ROM and function post operatively compared to baselines without pull    Baseline Did not remeasure today but P/ROM near full - 09/30/21    Status Partially Met      PT LONG TERM GOAL #2   Title Pt will report 75% improvement of neck pain at the end of every work day due to improved posture    Baseline Pt reports at least 50% improved at this time - 09/30/21    Status On-going      PT LONG TERM GOAL #3   Title Pt will be ind with final HEP for continued improved posture    Baseline Pt is independent with  current HEP - 09/30/21    Status On-going                   Plan - 10/13/21 1648     Clinical Impression Statement Pt is reporting doing much better with improved sleeping.  Still feels benefit from MT and PT sessions but is feeling okay with last appt being the last.  Will repeat SOZO next visit and schedule out.    PT Frequency 1x / week    PT Duration 4 weeks    PT Treatment/Interventions ADLs/Self Care Home Management;Therapeutic exercise;Patient/family education;Moist Heat;Manual techniques;Dry needling    PT Next Visit Plan * Do SOZO on 10/20/21 and schedule out*  Cont STM/MFR Rt axilla and bil UT, work on progressing reps with supine scap and other postural TE (bil UE 3 way raises).    Consulted and Agree with Plan of Care Patient             Patient will benefit from skilled therapeutic intervention in order to improve the following deficits and impairments:     Visit Diagnosis: Aftercare following surgery for neoplasm  Abnormal posture  Malignant neoplasm of upper-outer quadrant of right breast in female, estrogen receptor positive [facility])     Problem List Patient Active Problem List   Diagnosis Date Noted   Malignant neoplasm of upper-outer quadrant of right breast in female, estrogen receptor positive ([place]) 10/01/2020    [name], PT 10/13/2021, 4:51 PM  [place] @ [place], [place], [address] Phone: [phone]   Fax:  [phone]  Name: Ariel Bell MRN: [id number] Date of Birth: 09/16/1966

## 2021-10-20 ENCOUNTER — Other Ambulatory Visit: Payer: Self-pay

## 2021-10-20 ENCOUNTER — Ambulatory Visit: Payer: BC Managed Care – PPO | Admitting: Rehabilitation

## 2021-10-20 DIAGNOSIS — Z483 Aftercare following surgery for neoplasm: Secondary | ICD-10-CM

## 2021-10-20 DIAGNOSIS — R293 Abnormal posture: Secondary | ICD-10-CM | POA: Diagnosis not present

## 2021-10-20 DIAGNOSIS — C50411 Malignant neoplasm of upper-outer quadrant of right female breast: Secondary | ICD-10-CM

## 2021-10-20 NOTE — Therapy (Addendum)
[place] @ [place], [place], [address] Phone: [phone]   Fax:  [phone]  Physical Therapy Treatment  Patient Details  Name: Ariel Bell MRN: [id number] Date of Birth: 1966/04/04 Referring Provider (PT): Dr. Rolm Bookbinder   Encounter Date: 10/20/2021   PT End of Session - 10/20/21 1649     Visit Number 13    Number of Visits 13    Date for PT Re-Evaluation 10/28/21    PT Start Time 1600    PT Stop Time 1648    PT Time Calculation (min) 48 min    Activity Tolerance Patient tolerated treatment well    Behavior During Therapy [facility] for tasks assessed/performed             Past Medical History:  Diagnosis Date   Breast cancer ([place])    Heart murmur    History of radiation therapy 11/19/2020-12/15/2020   Right Breast IMRT; Dr. Gery Pray   Hypertension    Hypothyroidism    Personal history of radiation therapy     Past Surgical History:  Procedure Laterality Date   AXILLARY SENTINEL NODE BIOPSY Right 10/15/2020   Procedure: RIGHT AXILLARY SENTINEL NODE BIOPSY;  Surgeon: Rolm Bookbinder, MD;  Location: [facility];  Service: General;  Laterality: Right;   BREAST BIOPSY Right 08/2020   BREAST LUMPECTOMY Right 10/14/2020   BREAST LUMPECTOMY WITH RADIOACTIVE SEED AND SENTINEL LYMPH NODE BIOPSY Right 10/15/2020   Procedure: RIGHT BREAST LUMPECTOMY WITH RADIOACTIVE SEED;  Surgeon: Rolm Bookbinder, MD;  Location: [facility];  Service: General;  Laterality: Right;  PEC BLOCK   COLONOSCOPY     WISDOM TOOTH EXTRACTION      There were no vitals filed for this visit.       [facility] PT Assessment - 10/20/21 0001       AROM   Right Shoulder Flexion 155 Degrees    Right Shoulder ABduction 170 Degrees                L-DEX FLOWSHEETS - 10/20/21 1600       L-DEX LYMPHEDEMA SCREENING   Measurement Type Unilateral    L-DEX MEASUREMENT EXTREMITY Upper Extremity    POSITION  Standing     DOMINANT SIDE Right    At Risk Side Right    BASELINE SCORE (UNILATERAL) -13    L-DEX SCORE (UNILATERAL) -9.4    VALUE CHANGE (UNILAT) 3.6                       [facility] Adult PT Treatment/Exercise - 10/20/21 0001       Shoulder Exercises: Standing   Flexion Both;10 reps    Shoulder Flexion Weight (lbs) 2    ABduction Both;10 reps    Shoulder ABduction Weight (lbs) 2    Row Both;10 reps    Theraband Level (Shoulder Row) Level 1 (Yellow)    Other Standing Exercises bicep curls 2# 2x 10bil,      Shoulder Exercises: Stretch   Wall Stretch - ABduction 1 rep;20 seconds    Wall Stretch - ABduction Limitations review of single arm chest stretch      Manual Therapy   Soft tissue mobilization In supine to the right pectoralis, axilla, with cocoa butter and to bil upper traps in neutral and side bent positions    Myofascial Release to the Rt axilla towards the incision and along pectoralis insertion    Passive ROM of the Rt shoulder  PT Long Term Goals - 10/20/21 1601       PT LONG TERM GOAL #1   Title Patient will demonstrate she has regained full shoulder ROM and function post operatively compared to baselines without pull    Status Achieved      PT LONG TERM GOAL #2   Title Pt will report 75% improvement of neck pain at the end of every work day due to improved posture    Status Achieved      PT LONG TERM GOAL #3   Title Pt will be ind with final HEP for continued improved posture    Status Achieved                   Plan - 10/20/21 1650     Clinical Impression Statement Pt feels ready for DC and has met all goals.  Improved tolerance of work activities with less neck pain and less pullin the Rt axilla with overhead reach    PT Next Visit Plan cont SOZO    Consulted and Agree with Plan of Care Patient             Patient will benefit from skilled therapeutic intervention in order to improve the following  deficits and impairments:     Visit Diagnosis: Aftercare following surgery for neoplasm  Malignant neoplasm of upper-outer quadrant of right breast in female, estrogen receptor positive ([place])  Abnormal posture     Problem List Patient Active Problem List   Diagnosis Date Noted   Malignant neoplasm of upper-outer quadrant of right breast in female, estrogen receptor positive ([place]) 10/01/2020    [name], PT 10/20/2021, 4:51 PM  [place] @ [place], [place], [address] Phone: [phone]   Fax:  [phone]  Name: Ariel Bell MRN: [id number] Date of Birth: 04-20-1966   PHYSICAL THERAPY DISCHARGE SUMMARY  Visits from Start of Care: 13  Current functional level related to goals / functional outcomes: All goals met   Remaining deficits:    Education / Equipment: HEP Plan: Patient agrees to discharge.  Patient goals were not met. Patient is being discharged due to meeting the stated rehab goals.

## 2021-10-20 NOTE — Patient Instructions (Signed)
Access Code: [email or web address]: [email or web address]: 12/21/2022Prepared by: Marcene Brawn TevisExercises  Seated Cervical Sidebending Stretch - 1 x daily - 7 x weekly - 1-3 sets - 10 reps - 20-30 seconds hold  Seated Scapular Retraction - 1 x daily - 7 x weekly - 1-3 sets - 10 reps - 2-3 seconds hold  Single Arm Doorway Pec Stretch at 90 Degrees Abduction - 1 x daily - 7 x weekly - 3 reps - 20-30second hold  Standing Bicep Stretch at Wall - 1 x daily - 7 x weekly - 3 reps - 20-30 seconds hold  Seated Cervical Retraction - 1 x daily - 7 x weekly - 1-3 sets - 10 reps - 5 seconds hold  Scaption with Dumbbells - 1 x daily - 2 x weekly - 1-3 sets - 10 reps - no hold  Standing Shoulder Flexion to 90 Degrees with Dumbbells - 1 x daily - 2 x weekly - 1-3 sets - 10 reps - no hold  Standing Bicep Curls Supinated with Dumbbells - 1 x daily - 2 x weekly - 1-3 sets - 10 reps - no hold  Standing Row with Anchored Resistance - 1 x daily - 2 x weekly - 1-3 sets - 10 reps - no hold

## 2021-11-08 ENCOUNTER — Ambulatory Visit: Payer: BC Managed Care – PPO

## 2021-11-25 IMAGING — MG DIGITAL DIAGNOSTIC BILAT W/ TOMO W/ CAD
9 series · 9 of 25 positions shown · non-contrast
Comparison: Previous exam(s).

CLINICAL DATA: Patient presents for a bilateral diagnostic exam due
to history of right malignant lumpectomy August 2020.

EXAM:
DIGITAL DIAGNOSTIC BILATERAL MAMMOGRAM WITH TOMOSYNTHESIS AND CAD
TECHNIQUE: Bilateral digital diagnostic mammography and breast tomosynthesis
was performed. The images were evaluated with computer-aided
detection.

[R MLO]
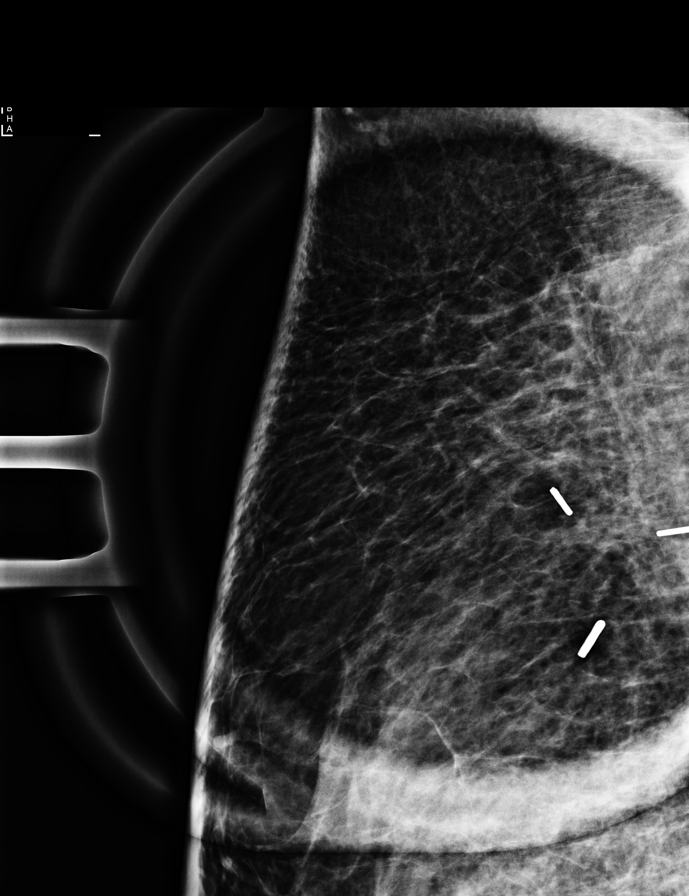

[L CC synth-2D]
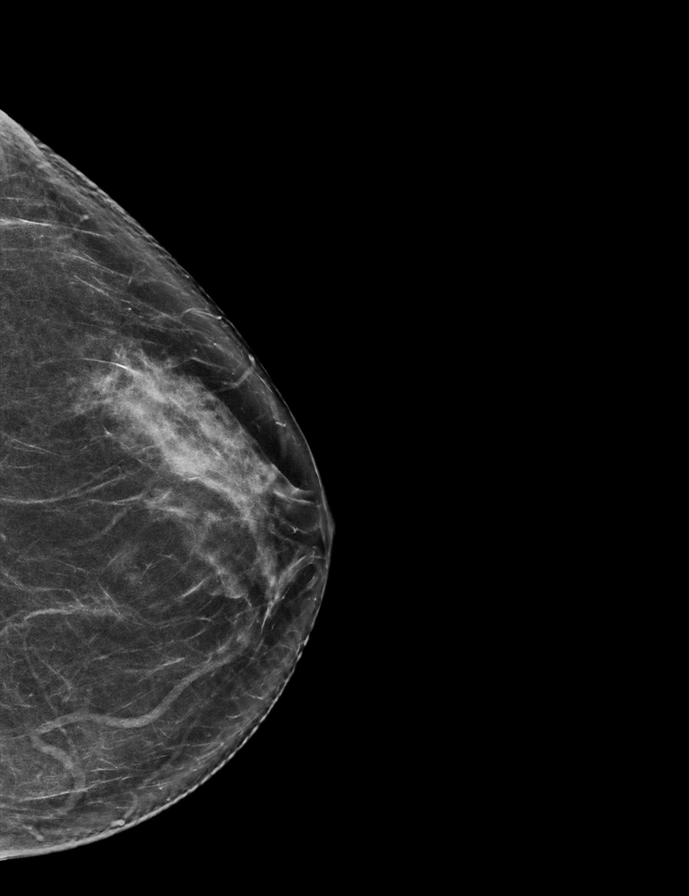

[L MLO synth-2D]
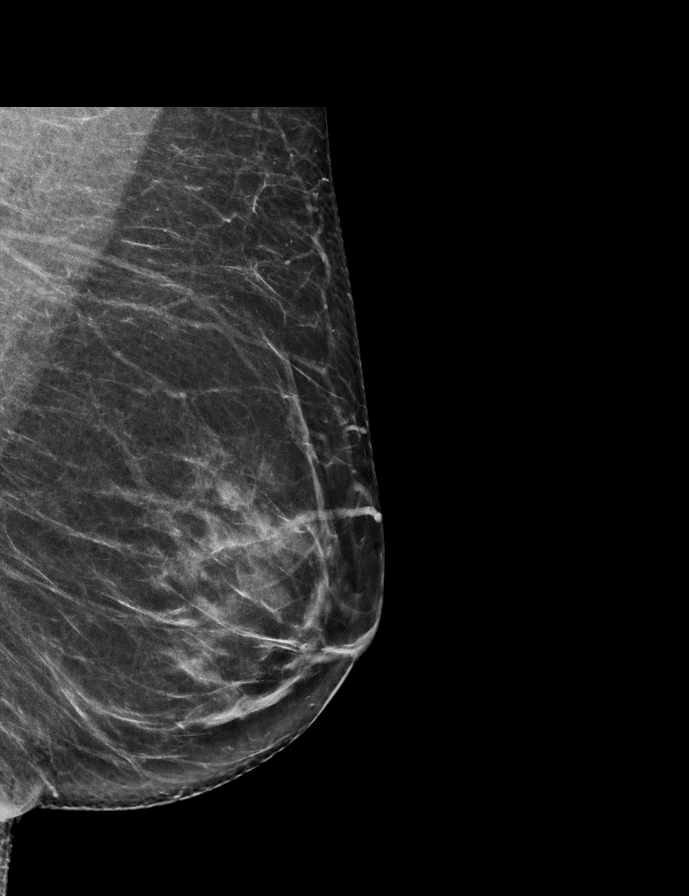

[R CC synth-2D]
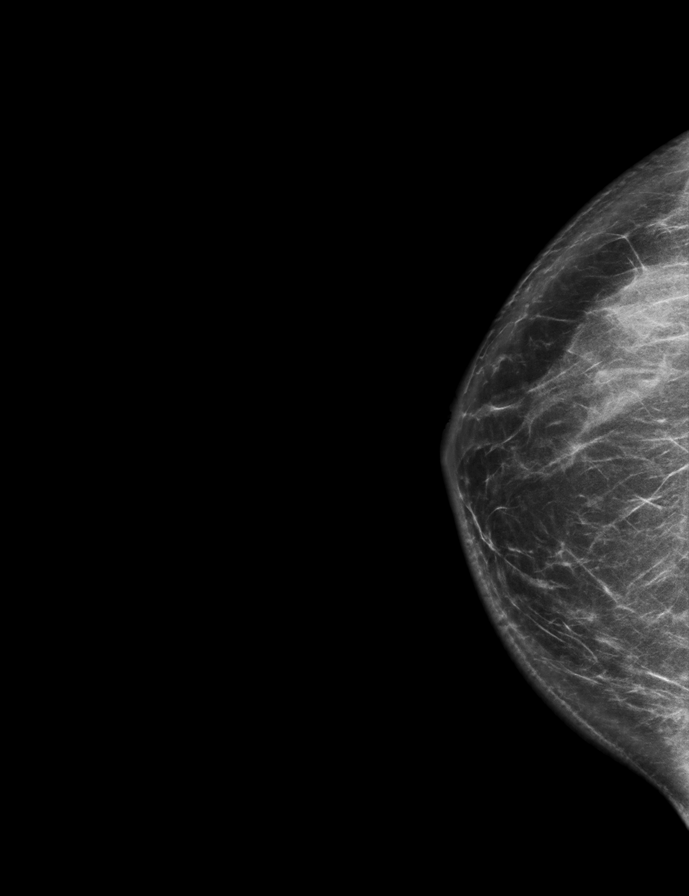

[R MLO synth-2D]
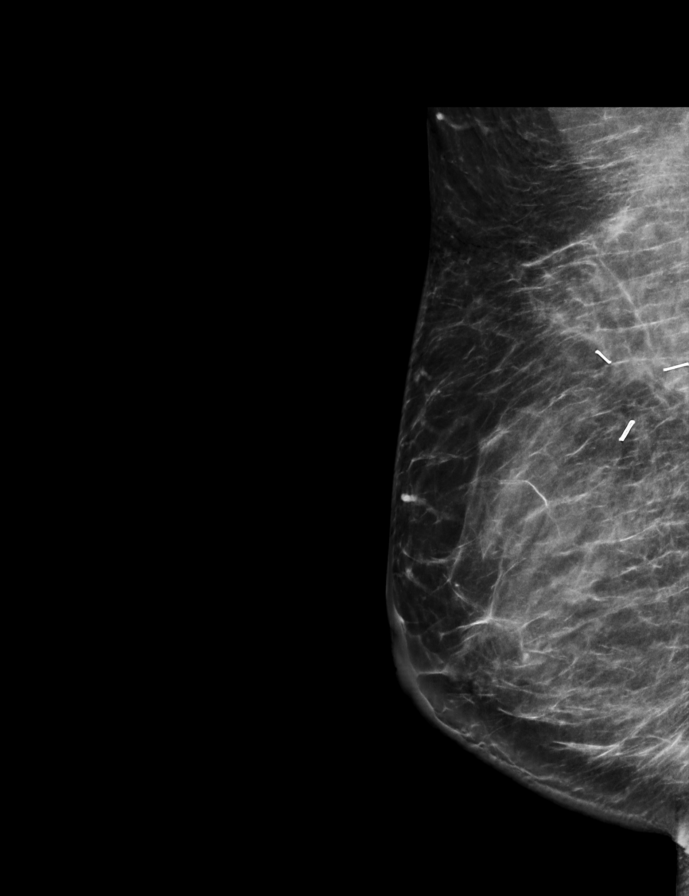

[L MLO tomo · tomo slice 38/75.0]
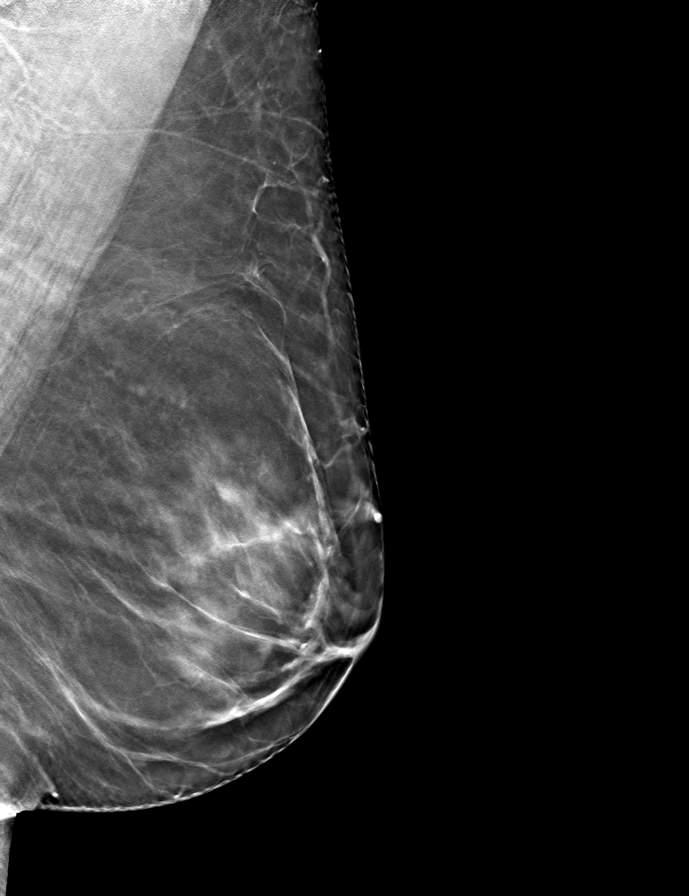

[L CC tomo · tomo slice 35/69.0]
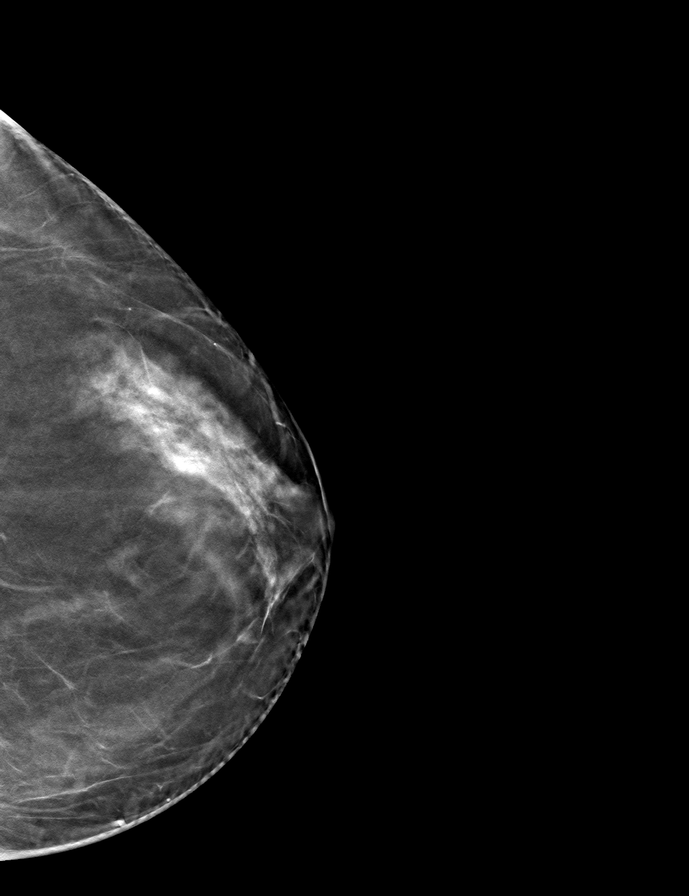

[R MLO tomo · tomo slice 41/80.0]
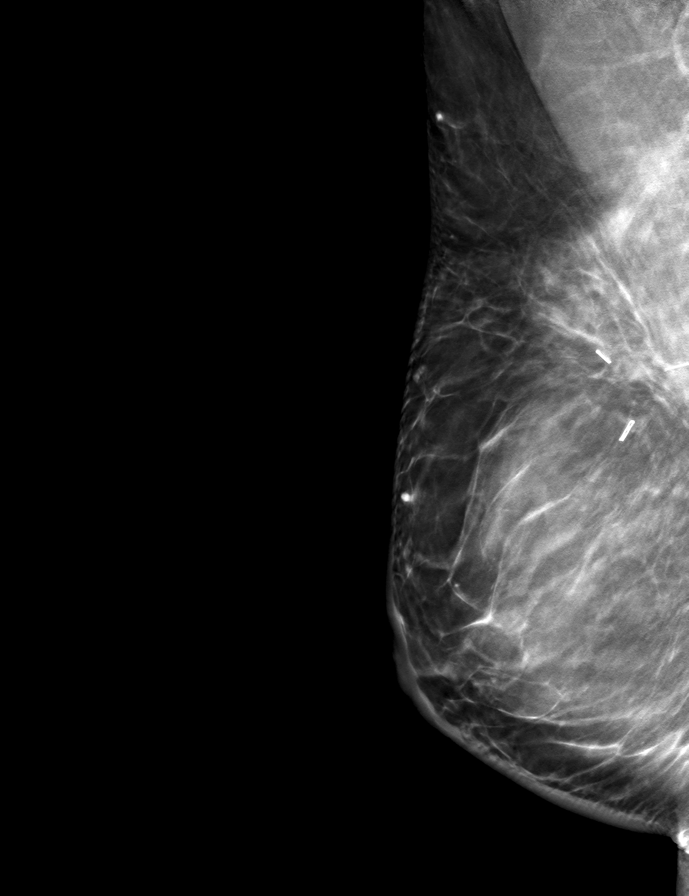

[R CC tomo · tomo slice 43/86.0]
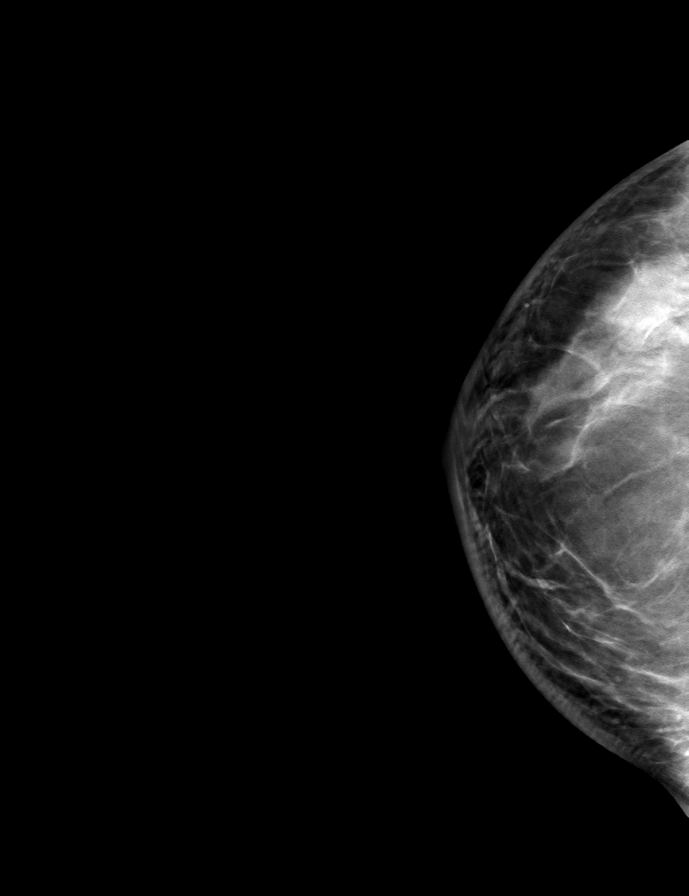

[9 of 25 positions shown; findings below may reference images not displayed]

ACR Breast Density Category c: The breast tissue is heterogeneously
dense, which may obscure small masses.
FINDINGS: Exam demonstrates expected post lumpectomy changes over the
posterior upper outer right breast. Remainder of the right breast as
well as the left breast is unchanged.
IMPRESSION: Expected post lumpectomy changes over the upper-outer right breast.

RECOMMENDATION:
Recommend continued annual bilateral diagnostic mammographic
evaluation.

I have discussed the findings and recommendations with the patient.
If applicable, a reminder letter will be sent to the patient
regarding the next appointment.

BI-RADS CATEGORY  2: Benign.

## 2021-12-14 ENCOUNTER — Encounter (HOSPITAL_COMMUNITY): Payer: Self-pay

## 2022-01-24 ENCOUNTER — Ambulatory Visit: Payer: BC Managed Care – PPO | Attending: General Surgery

## 2022-01-24 ENCOUNTER — Other Ambulatory Visit: Payer: Self-pay

## 2022-01-24 VITALS — Wt 141.0 lb

## 2022-01-24 DIAGNOSIS — Z483 Aftercare following surgery for neoplasm: Secondary | ICD-10-CM | POA: Insufficient documentation

## 2022-01-24 NOTE — Therapy (Signed)
?  OUTPATIENT PHYSICAL THERAPY SOZO SCREENING NOTE ? ? ?Patient Name: Ariel Bell ?MRN: [id number] ?DOB:08-03-1966, 56 y.o., female ?Today's Date: 01/24/2022 ? ?PCP: Arvella Nigh, MD ?REFERRING PROVIDER: Rolm Bookbinder, MD ? ? PT End of Session - 01/24/22 1655   ? ? Visit Number 10   # unchanged due to screen only  ? PT Start Time 0413   ? PT Stop Time 1702   ? PT Time Calculation (min) 8 min   ? Activity Tolerance Patient tolerated treatment well   ? Behavior During Therapy [facility] for tasks assessed/performed   ? ?  ?  ? ?  ? ? ?Past Medical History:  ?Diagnosis Date  ? Breast cancer ([place])   ? Heart murmur   ? History of radiation therapy 11/19/2020-12/15/2020  ? Right Breast IMRT; Dr. Gery Pray  ? Hypertension   ? Hypothyroidism   ? Personal history of radiation therapy   ? ?Past Surgical History:  ?Procedure Laterality Date  ? AXILLARY SENTINEL NODE BIOPSY Right 10/15/2020  ? Procedure: RIGHT AXILLARY SENTINEL NODE BIOPSY;  Surgeon: Rolm Bookbinder, MD;  Location: [place];  Service: General;  Laterality: Right;  ? BREAST BIOPSY Right 08/2020  ? BREAST LUMPECTOMY Right 10/14/2020  ? BREAST LUMPECTOMY WITH RADIOACTIVE SEED AND SENTINEL LYMPH NODE BIOPSY Right 10/15/2020  ? Procedure: RIGHT BREAST LUMPECTOMY WITH RADIOACTIVE SEED;  Surgeon: Rolm Bookbinder, MD;  Location: [place];  Service: General;  Laterality: Right;  PEC BLOCK  ? COLONOSCOPY    ? WISDOM TOOTH EXTRACTION    ? ?Patient Active Problem List  ? Diagnosis Date Noted  ? Malignant neoplasm of upper-outer quadrant of right breast in female, estrogen receptor positive ([place]) 10/01/2020  ? ? ?REFERRING DIAG: right breast cancer at risk for lymphedema ? ?THERAPY DIAG:  ?Aftercare following surgery for neoplasm ? 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 She underwent a right lumpectomy and sentinel node biopsy (1 negative node) on 10/15/2020 It is ER/PR positive and HER2  negative with a Ki67 of 10%. ? ?PRECAUTIONS: right UE Lymphedema risk, None ? ?SUBJECTIVE: Pt returns for her 3 month L-Dex screen.  ? ?PAIN:  ?Are you having pain? No ? ?SOZO SCREENING: ?Patient was assessed today using the SOZO machine to determine the lymphedema index score. This was compared to her baseline score. It was determined that she is within the recommended range when compared to her baseline and no further action is needed at this time. She will continue SOZO screenings. These are done every 3 months for 2 years post operatively followed by every 6 months for 2 years, and then annually. ? ? ? ? ?Otelia Limes, PTA ?01/24/2022, 5:03 PM ? ?  ? ?

## 2022-05-02 ENCOUNTER — Ambulatory Visit: Payer: BC Managed Care – PPO | Attending: General Surgery

## 2022-05-02 VITALS — Wt 142.2 lb

## 2022-05-02 DIAGNOSIS — Z483 Aftercare following surgery for neoplasm: Secondary | ICD-10-CM | POA: Insufficient documentation

## 2022-05-02 NOTE — Therapy (Signed)
OUTPATIENT PHYSICAL THERAPY SOZO SCREENING NOTE   Patient Name: Ariel Bell MRN: [id number] DOB:[birth year], 56 y.o., female Today's Date: 05/02/2022  PCP: Arvella Nigh, MD REFERRING PROVIDER: Arvella Nigh, MD   PT End of Session - 05/02/22 1655     Visit Number 10   # unchanged due to screen only   PT Start Time 1651    PT Stop Time 1658    PT Time Calculation (min) 7 min    Activity Tolerance Patient tolerated treatment well    Behavior During Therapy [facility] for tasks assessed/performed             Past Medical History:  Diagnosis Date   Breast cancer ([place])    Heart murmur    History of radiation therapy 11/19/2020-12/15/2020   Right Breast IMRT; Dr. Gery Pray   Hypertension    Hypothyroidism    Personal history of radiation therapy    Past Surgical History:  Procedure Laterality Date   AXILLARY SENTINEL NODE BIOPSY Right 10/15/2020   Procedure: RIGHT AXILLARY SENTINEL NODE BIOPSY;  Surgeon: Rolm Bookbinder, MD;  Location: [place];  Service: General;  Laterality: Right;   BREAST BIOPSY Right 08/2020   BREAST LUMPECTOMY Right 10/14/2020   BREAST LUMPECTOMY WITH RADIOACTIVE SEED AND SENTINEL LYMPH NODE BIOPSY Right 10/15/2020   Procedure: RIGHT BREAST LUMPECTOMY WITH RADIOACTIVE SEED;  Surgeon: Rolm Bookbinder, MD;  Location: [place];  Service: General;  Laterality: Right;  PEC BLOCK   COLONOSCOPY     WISDOM TOOTH EXTRACTION     Patient Active Problem List   Diagnosis Date Noted   Malignant neoplasm of upper-outer quadrant of right breast in female, estrogen receptor positive ([place]) 10/01/2020    REFERRING DIAG: right breast cancer at risk for lymphedema  THERAPY DIAG: Aftercare following surgery for neoplasm  PERTINENT HISTORY: Patient was diagnosed on 09/02/2020 with 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 She underwent a right lumpectomy and sentinel node biopsy (1 negative node) on 10/15/2020 It is ER/PR positive and HER2 negative  with a Ki67 of 10%.  PRECAUTIONS: right UE Lymphedema risk, None  SUBJECTIVE: Pt returns for her 3 month L-Dex screen.   PAIN:  Are you having pain? No  SOZO SCREENING:  Patient was assessed today using the SOZO machine to determine the lymphedema index score. This was compared to her baseline score. It was determined that she is NOT within the recommended range when compared to her baseline and so she was fitted for a compression garment while in the clinic today. It is recommended she return in 1 month to be reassessed. If she continues to measure outside the recommended range, physical therapy treatment will be recommended at that time and a referral requested.  PLEASE KEEP YOUR COMPRESSION GARMENT ON DURING THE DAY TO GET THE BEST SWELLING REDUCTION. HERE ARE SOME ADDITIONAL TIPS: Do not sleep in your garment. If you have pain or notice swelling in your hand or at the top of your shoulder, call your therapist. This may be a sign that you need a different garment. 3.  Take good care of your garment so it lasts longer: Follow washing instructions on your garment label or box. Wash periodically using a mild detergent in warm water.  Do not use fabric softener or bleach.  Place garment in a mesh lingerie bag and use the gentle cycle of the washing machine or hand wash. Tumble dry low or lay flat to dry. TAKE CARE OF YOUR SKIN Apply  a low pH moisturizing lotion to your skin daily Avoid scratching your skin Treat skin irritations quickly  Know the 5 warning signs of infection: redness, pain, warmth to touch, fever and increased swelling.  Call your physician immediately if you notice any of these signs of a possible infection.    Otelia Limes, PTA 05/02/2022, 4:57 PM

## 2022-05-02 NOTE — Patient Instructions (Signed)
PLEASE KEEP YOUR COMPRESSION GARMENT ON DURING THE DAY TO GET THE BEST SWELLING REDUCTION. HERE ARE SOME ADDITIONAL TIPS: Do not sleep in your garment. If you have pain or notice swelling in your hand or at the top of your shoulder, call your therapist. This may be a sign that you need a different garment. 3.  Take good care of your garment so it lasts longer: Follow washing instructions on your garment label or box. Wash periodically using a mild detergent in warm water.  Do not use fabric softener or bleach.  Place garment in a mesh lingerie bag and use the gentle cycle of the washing machine or hand wash. Tumble dry low or lay flat to dry. TAKE CARE OF YOUR SKIN Apply a low pH moisturizing lotion to your skin daily Avoid scratching your skin Treat skin irritations quickly  Know the 5 warning signs of infection: redness, pain, warmth to touch, fever and increased swelling.  Call your physician immediately if you notice any of these signs of a possible infection.  

## 2022-06-13 ENCOUNTER — Ambulatory Visit: Payer: BC Managed Care – PPO | Attending: General Surgery

## 2022-06-13 VITALS — Wt 144.2 lb

## 2022-06-13 DIAGNOSIS — Z483 Aftercare following surgery for neoplasm: Secondary | ICD-10-CM | POA: Insufficient documentation

## 2022-06-13 NOTE — Therapy (Signed)
  OUTPATIENT PHYSICAL THERAPY SOZO SCREENING NOTE   Patient Name: Ariel Bell MRN: [id number] DOB:08/03/1966, 56 y.o., female Today's Date: 06/13/2022  PCP: Arvella Nigh, MD REFERRING PROVIDER: Rolm Bookbinder, MD   PT End of Session - 06/13/22 1637     Visit Number 10   # unchanged due to screen only   PT Start Time 6283    PT Stop Time 6629    PT Time Calculation (min) 8 min    Activity Tolerance Patient tolerated treatment well    Behavior During Therapy [facility] for tasks assessed/performed             Past Medical History:  Diagnosis Date   Breast cancer ([place])    Heart murmur    History of radiation therapy 11/19/2020-12/15/2020   Right Breast IMRT; Dr. Gery Pray   Hypertension    Hypothyroidism    Personal history of radiation therapy    Past Surgical History:  Procedure Laterality Date   AXILLARY SENTINEL NODE BIOPSY Right 10/15/2020   Procedure: RIGHT AXILLARY SENTINEL NODE BIOPSY;  Surgeon: Rolm Bookbinder, MD;  Location: [place];  Service: General;  Laterality: Right;   BREAST BIOPSY Right 08/2020   BREAST LUMPECTOMY Right 10/14/2020   BREAST LUMPECTOMY WITH RADIOACTIVE SEED AND SENTINEL LYMPH NODE BIOPSY Right 10/15/2020   Procedure: RIGHT BREAST LUMPECTOMY WITH RADIOACTIVE SEED;  Surgeon: Rolm Bookbinder, MD;  Location: [place];  Service: General;  Laterality: Right;  PEC BLOCK   COLONOSCOPY     WISDOM TOOTH EXTRACTION     Patient Active Problem List   Diagnosis Date Noted   Malignant neoplasm of upper-outer quadrant of right breast in female, estrogen receptor positive ([place]) 10/01/2020    REFERRING DIAG: right breast cancer at risk for lymphedema  THERAPY DIAG: Aftercare following surgery for neoplasm  PERTINENT HISTORY: Patient was diagnosed on 09/02/2020 with 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 She underwent a right lumpectomy and sentinel node biopsy (1 negative node) on 10/15/2020 It is ER/PR positive and HER2  negative with a Ki67 of 10%.  PRECAUTIONS: right UE Lymphedema risk, None  SUBJECTIVE: Pt returns for her 1 month L-Dex screen after having a high change from baseline indicating subclinical lymphedema.   PAIN:  Are you having pain? No  SOZO SCREENING:  Patient was assessed today using the SOZO machine to determine the lymphedema index score. This was compared to her baseline score. It was determined that she is NOT within the recommended range when compared to her baseline and so she was fitted for a compression garment while in the clinic today. It is recommended she return in 1 month to be reassessed. Pt will cont wear of compression sleeve x1 more month. If she continues to measure outside the recommended range, physical therapy treatment will be recommended at that time and a referral requested.    L-DEX FLOWSHEETS - 06/13/22 1600       L-DEX LYMPHEDEMA SCREENING   Measurement Type Unilateral    L-DEX MEASUREMENT EXTREMITY Upper Extremity    POSITION  Standing    DOMINANT SIDE Right    At Risk Side Right    BASELINE SCORE (UNILATERAL) -13    L-DEX SCORE (UNILATERAL) -5.1    VALUE CHANGE (UNILAT) 7.9              Ariel Bell, PTA 06/13/2022, 4:45 PM

## 2022-06-17 ENCOUNTER — Other Ambulatory Visit: Payer: Self-pay | Admitting: Obstetrics and Gynecology

## 2022-06-17 DIAGNOSIS — Z9889 Other specified postprocedural states: Secondary | ICD-10-CM

## 2022-07-19 ENCOUNTER — Ambulatory Visit
Admission: RE | Admit: 2022-07-19 | Discharge: 2022-07-19 | Disposition: A | Discharge status: Home / Self Care | Payer: BC Managed Care – PPO | Source: Ambulatory Visit | Attending: Obstetrics and Gynecology | Admitting: Obstetrics and Gynecology

## 2022-07-19 DIAGNOSIS — Z9889 Other specified postprocedural states: Secondary | ICD-10-CM

## 2022-07-25 ENCOUNTER — Ambulatory Visit: Payer: BC Managed Care – PPO | Attending: General Surgery

## 2022-07-25 VITALS — Wt 146.1 lb

## 2022-07-25 DIAGNOSIS — Z483 Aftercare following surgery for neoplasm: Secondary | ICD-10-CM | POA: Insufficient documentation

## 2022-07-25 NOTE — Therapy (Addendum)
OUTPATIENT PHYSICAL THERAPY SOZO SCREENING NOTE   Patient Name: Ariel Bell MRN: [id number] DOB:[birth year], 56 y.o., female Today's Date: 07/25/2022  PCP: Arvella Nigh, MD REFERRING PROVIDER: Rolm Bookbinder, MD   PT End of Session - 07/25/22 1651     Visit Number 10   # unchanged due to screen only   PT Start Time 1648    PT Stop Time 4098    PT Time Calculation (min) 5 min    Activity Tolerance Patient tolerated treatment well    Behavior During Therapy [facility] for tasks assessed/performed             Past Medical History:  Diagnosis Date   Breast cancer ([place])    Heart murmur    History of radiation therapy 11/19/2020-12/15/2020   Right Breast IMRT; Dr. Gery Pray   Hypertension    Hypothyroidism    Personal history of radiation therapy    Past Surgical History:  Procedure Laterality Date   AXILLARY SENTINEL NODE BIOPSY Right 10/15/2020   Procedure: RIGHT AXILLARY SENTINEL NODE BIOPSY;  Surgeon: Rolm Bookbinder, MD;  Location: [place];  Service: General;  Laterality: Right;   BREAST BIOPSY Right 08/2020   BREAST LUMPECTOMY Right 10/14/2020   BREAST LUMPECTOMY WITH RADIOACTIVE SEED AND SENTINEL LYMPH NODE BIOPSY Right 10/15/2020   Procedure: RIGHT BREAST LUMPECTOMY WITH RADIOACTIVE SEED;  Surgeon: Rolm Bookbinder, MD;  Location: [place];  Service: General;  Laterality: Right;  PEC BLOCK   COLONOSCOPY     WISDOM TOOTH EXTRACTION     Patient Active Problem List   Diagnosis Date Noted   Malignant neoplasm of upper-outer quadrant of right breast in female, estrogen receptor positive ([place]) 10/01/2020    REFERRING DIAG: right breast cancer at risk for lymphedema  THERAPY DIAG: Aftercare following surgery for neoplasm  PERTINENT HISTORY: Patient was diagnosed on 09/02/2020 with 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 She underwent a right lumpectomy and sentinel node biopsy (1 negative node) on 10/15/2020 It is ER/PR positive and HER2  negative with a Ki67 of 10%.  PRECAUTIONS: right UE Lymphedema risk, None  SUBJECTIVE: Pt returns for her 1 month L-Dex screen after having a high change from baseline indicating subclinical lymphedema.   PAIN:  Are you having pain? No  SOZO SCREENING:  Patient was assessed today using the SOZO machine to determine the lymphedema index score. This was compared to her baseline score. It was determined that she is NOT within the recommended range when compared to her baseline.  It is recommended she begin physical therapy at this time as her change from baseline has been elevated now x3 months.  Pt will cont wear of compression sleeve. Patient reported a change in status to PTA which initiated the PTA consulting with a PT. PT determined it would be appropriate to initiate therapy at this time. PT requested a referral from patient's provider. She was scheduled for an evaluation 07/27/22.    L-DEX FLOWSHEETS - 07/25/22 1600       L-DEX LYMPHEDEMA SCREENING   Measurement Type Unilateral    L-DEX MEASUREMENT EXTREMITY Upper Extremity    POSITION  Standing    DOMINANT SIDE Right    At Risk Side Right    BASELINE SCORE (UNILATERAL) -13    L-DEX SCORE (UNILATERAL) -4.9    VALUE CHANGE (UNILAT) 8.1               Otelia Limes, PTA 07/25/2022, 5:14 PM

## 2022-07-26 ENCOUNTER — Other Ambulatory Visit: Payer: Self-pay | Admitting: *Deleted

## 2022-07-26 DIAGNOSIS — C50411 Malignant neoplasm of upper-outer quadrant of right female breast: Secondary | ICD-10-CM

## 2022-07-27 ENCOUNTER — Ambulatory Visit: Payer: BC Managed Care – PPO | Attending: Hematology and Oncology | Admitting: Physical Therapy

## 2022-07-27 ENCOUNTER — Encounter: Payer: Self-pay | Admitting: Physical Therapy

## 2022-07-27 ENCOUNTER — Other Ambulatory Visit: Payer: Self-pay

## 2022-07-27 DIAGNOSIS — Z170 Estrogen receptor positive status [ER+]: Secondary | ICD-10-CM | POA: Diagnosis not present

## 2022-07-27 DIAGNOSIS — I890 Lymphedema, not elsewhere classified: Secondary | ICD-10-CM | POA: Diagnosis present

## 2022-07-27 DIAGNOSIS — C50411 Malignant neoplasm of upper-outer quadrant of right female breast: Secondary | ICD-10-CM | POA: Insufficient documentation

## 2022-07-27 NOTE — Therapy (Signed)
OUTPATIENT PHYSICAL THERAPY ONCOLOGY EVALUATION  Patient Name: Ariel Bell MRN: [id number] DOB:Oct 29, 1966, 56 y.o., female Today's Date: 07/27/2022    Past Medical History:  Diagnosis Date   Breast cancer ([place])    Heart murmur    History of radiation therapy 11/19/2020-12/15/2020   Right Breast IMRT; Dr. Gery Pray   Hypertension    Hypothyroidism    Personal history of radiation therapy    Past Surgical History:  Procedure Laterality Date   AXILLARY SENTINEL NODE BIOPSY Right 10/15/2020   Procedure: RIGHT AXILLARY SENTINEL NODE BIOPSY;  Surgeon: Rolm Bookbinder, MD;  Location: [place];  Service: General;  Laterality: Right;   BREAST BIOPSY Right 08/2020   BREAST LUMPECTOMY Right 10/14/2020   BREAST LUMPECTOMY WITH RADIOACTIVE SEED AND SENTINEL LYMPH NODE BIOPSY Right 10/15/2020   Procedure: RIGHT BREAST LUMPECTOMY WITH RADIOACTIVE SEED;  Surgeon: Rolm Bookbinder, MD;  Location: [place];  Service: General;  Laterality: Right;  PEC BLOCK   COLONOSCOPY     WISDOM TOOTH EXTRACTION     Patient Active Problem List   Diagnosis Date Noted   Malignant neoplasm of upper-outer quadrant of right breast in female, estrogen receptor positive ([place]) 10/01/2020    PCP: Arvella Nigh, MD  REFERRING PROVIDER: Benay Pike, MD  REFERRING DIAG: C50.411,Z17.0 (ICD-10-CM) - Malignant neoplasm of upper-outer quadrant of right breast in female, estrogen receptor positive ([place])  THERAPY DIAG:  Lymphedema, not elsewhere classified  Malignant neoplasm of upper-outer quadrant of right female breast, unspecified estrogen receptor status ([place])  ONSET DATE: 05/02/22  Rationale for Evaluation and Treatment Rehabilitation  SUBJECTIVE                                                                                                                                                                                           SUBJECTIVE STATEMENT: I have been wearing the sleeve but not consistently.  My SOZO score kept going up and they thought this would help.  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 She underwent a right lumpectomy and sentinel node biopsy (1 negative node) on 10/15/2020 It is ER/PR positive and HER2 negative with a Ki67 of 10%. PAIN:  Are you having pain? No  PRECAUTIONS: Other: at risk for lymphedema on R  WEIGHT BEARING RESTRICTIONS No  FALLS:  Has patient fallen in last 6 months? No  LIVING ENVIRONMENT: Lives with: lives alone Lives in: House/apartment Stairs: No;  Has following equipment at home: None  OCCUPATION: full time at [employer], part at [employer]: pt reports she does not exercise like  she should  HAND DOMINANCE : right   PRIOR LEVEL OF FUNCTION: Independent  PATIENT GOALS to not have to wear the sleeve and to get rid of the swelling   OBJECTIVE  COGNITION:  Overall cognitive status: Within functional limits for tasks assessed   OBSERVATIONS / OTHER ASSESSMENTS: no observable swelling today  POSTURE: forward head, rounded shoulders    LYMPHEDEMA ASSESSMENTS:   SURGERY TYPE/DATE: R lumpectomy and SLNB 10/15/20  NUMBER OF LYMPH NODES REMOVED: 0/1  CHEMOTHERAPY: not required  RADIATION:completed  HORMONE TREATMENT: none  INFECTIONS: none  LYMPHEDEMA ASSESSMENTS:   LANDMARK RIGHT  eval  10 cm proximal to olecranon process 26.2  Olecranon process 23  10 cm proximal to ulnar styloid process 20  Just proximal to ulnar styloid process 14.5  Across hand at thumb web space 17.4  At base of 2nd digit 5.9  (Blank rows = not tested)  LANDMARK LEFT  eval  10 cm proximal to olecranon process 27.5  Olecranon process 23.4  10 cm proximal to ulnar styloid process 19.1  Just proximal to ulnar styloid process 14.9  Across hand at thumb web space 18.5  At base of 2nd digit 6  (Blank rows = not tested)     TODAY'S TREATMENT   07/27/22: In supine: Short neck, 5 diaphragmatic breaths, L axillary nodes and establishment of interaxillary pathway, R inguinal nodes and establishment of axilloinguinal pathway, then R UE working proximal to distal, moving inner upper arm outwards and upwards, and doing both sides of forearms,  then retracing all steps - instructed pt throughout in anatomy and physiology of the lymphatic system and basic principles of self MLD including importance of correct skin stretch   PATIENT EDUCATION:  Education details: anatomy and physiology of the lymphatic system Person educated: Patient Education method: Explanation Education comprehension: verbalized understanding   HOME EXERCISE PROGRAM: Wear compression sleeve for 12 hrs a day for the next 4 weeks  ASSESSMENT:  CLINICAL IMPRESSION: Patient is a 56 y.o. female who was seen today for physical therapy evaluation and treatment for R UE subclinical lymphedema. Pt reports she had a high ldex score in July and was issued a sleeve at that time. She wore the sleeve 12 hrs a day for 4 weeks and in August her score was still elevated (in the yellow - not red). The next four weeks she was not as compliant with wearing her sleeve. Her ldex score increased again so pt was referred to PT to be instructed in MLD to help decrease edema. Pt would benefit from skilled PT services to decrease R UE subclinical lymphedema and instruct pt in self MLD.     OBJECTIVE IMPAIRMENTS increased edema.   ACTIVITY LIMITATIONS  none  PARTICIPATION LIMITATIONS:  none  PERSONAL FACTORS  none  are also affecting patient's functional outcome.   REHAB POTENTIAL: Excellent  CLINICAL DECISION MAKING: Stable/uncomplicated  EVALUATION COMPLEXITY: Low  GOALS: Goals reviewed with patient? Yes  SHORT TERM GOALS=LONG TERM GOALS  Target date: 08/24/2022    Pt will be independent in self MLD for long term management of edema.  Baseline: Goal status: INITIAL  2.  Pt will  return to a lower ldex score in the green to reverse subclinical lymphedema.  Baseline:  Goal status: INITIAL  PLAN: PT FREQUENCY: 2x/week  PT DURATION: 4 weeks  PLANNED INTERVENTIONS: Patient/Family education, Self Care, Manual lymph drainage, Compression bandaging, Taping, and Vasopneumatic device  PLAN FOR NEXT SESSION: instruct pt in self MLD and have  her return demonstrate, once she is indep in self MLD can decrease to 1x/wk, at end of 4 weeks around 10/25 repeat [place], PT 07/27/2022, 3:56 PM

## 2022-08-01 ENCOUNTER — Ambulatory Visit: Payer: BC Managed Care – PPO | Attending: Hematology and Oncology

## 2022-08-01 DIAGNOSIS — Z170 Estrogen receptor positive status [ER+]: Secondary | ICD-10-CM | POA: Diagnosis present

## 2022-08-01 DIAGNOSIS — C50411 Malignant neoplasm of upper-outer quadrant of right female breast: Secondary | ICD-10-CM | POA: Diagnosis present

## 2022-08-01 DIAGNOSIS — R293 Abnormal posture: Secondary | ICD-10-CM | POA: Insufficient documentation

## 2022-08-01 DIAGNOSIS — Z483 Aftercare following surgery for neoplasm: Secondary | ICD-10-CM | POA: Diagnosis present

## 2022-08-01 DIAGNOSIS — I890 Lymphedema, not elsewhere classified: Secondary | ICD-10-CM | POA: Diagnosis present

## 2022-08-01 NOTE — Patient Instructions (Signed)
Start with circles near the neck, 5 times each side.      Cancer Rehab [phone]  Deep Effective Breath   Standing, sitting, or laying down, place both hands on the belly. Take a deep breath IN, expanding the belly; then breath OUT, contracting the belly. Repeat __5__ times. Do __2-3__ sessions per day and before your self massage.  Axilla to Axilla - Sweep   On uninvolved side make 5 circles in the armpit, then pump _5__ times from involved armpit across chest to uninvolved armpit, making a pathway. Do _1__ time per day.  Copyright  VHI. All rights reserved.  Axilla to Inguinal Nodes - Pump   On involved side, make 5 circles at groin at panty line, then pump _5__ times from armpit along side of trunk to outer hip, making your other pathway. Do __1_ time per day.  Copyright  VHI. All rights reserved.  Arm Posterior: Elbow to Shoulder - Pump   Pump _5__ times from back of elbow to top of shoulder. Then inner to outer upper arm _5_ times, then outer arm again _5_ times. Then back to the pathways _2-3_ times. Do _1__ time per day.  Copyright  VHI. All rights reserved.  ARM: [place] Wrist to Elbow - Sweep   Pump or stationary circles _5__ times from wrist to elbow making sure to do both sides of the forearm. Then retrace your steps to the outer arm, and the pathways _2-3_ times each. Do _1__ time per day.  Copyright  VHI. All rights reserved.  ARM: Dorsum of Hand to Shoulder - Sweep   Pump or stationary circles _5__ times on back of hand including knuckle spaces and individual fingers if needed working up towards the wrist, then retrace all your steps working back up the forearm, doing both sides; upper outer arm and back to your pathways _2-3_ times each. Then do 5 circles again at uninvolved armpit and involved groin where you started! Good job!! Do __1_ time per day.  Copyright  VHI. All rights reserved.

## 2022-08-01 NOTE — Therapy (Signed)
OUTPATIENT PHYSICAL THERAPY ONCOLOGY TREATMENT  Patient Name: Ariel Bell MRN: [id number] DOB:1966-09-30, 56 y.o., female Today's Date: 08/01/2022   PT End of Session - 08/01/22 1611     Visit Number 2    Number of Visits 9    Date for PT Re-Evaluation 08/24/22    PT Start Time 4825    PT Stop Time 0037    PT Time Calculation (min) 60 min    Activity Tolerance Patient tolerated treatment well    Behavior During Therapy [facility] for tasks assessed/performed             Past Medical History:  Diagnosis Date   Breast cancer ([place])    Heart murmur    History of radiation therapy 11/19/2020-12/15/2020   Right Breast IMRT; Dr. Gery Pray   Hypertension    Hypothyroidism    Personal history of radiation therapy    Past Surgical History:  Procedure Laterality Date   AXILLARY SENTINEL NODE BIOPSY Right 10/15/2020   Procedure: RIGHT AXILLARY SENTINEL NODE BIOPSY;  Surgeon: Rolm Bookbinder, MD;  Location: [place];  Service: General;  Laterality: Right;   BREAST BIOPSY Right 08/2020   BREAST LUMPECTOMY Right 10/14/2020   BREAST LUMPECTOMY WITH RADIOACTIVE SEED AND SENTINEL LYMPH NODE BIOPSY Right 10/15/2020   Procedure: RIGHT BREAST LUMPECTOMY WITH RADIOACTIVE SEED;  Surgeon: Rolm Bookbinder, MD;  Location: [place];  Service: General;  Laterality: Right;  PEC BLOCK   COLONOSCOPY     WISDOM TOOTH EXTRACTION     Patient Active Problem List   Diagnosis Date Noted   Malignant neoplasm of upper-outer quadrant of right breast in female, estrogen receptor positive ([place]) 10/01/2020    PCP: Arvella Nigh, MD  REFERRING PROVIDER: Benay Pike, MD  REFERRING DIAG: C50.411,Z17.0 (ICD-10-CM) - Malignant neoplasm of upper-outer quadrant of right breast in female, estrogen receptor positive ([place])  THERAPY DIAG:  Lymphedema, not elsewhere classified  Malignant neoplasm of upper-outer quadrant of right female breast, unspecified estrogen receptor status ([place])  ONSET DATE:  05/02/22  Rationale for Evaluation and Treatment Rehabilitation  SUBJECTIVE                                                                                                                                                                                           SUBJECTIVE STATEMENT: I've been wearing my compression sleeve and that's going fine, better than I thought it would.    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 She underwent a right lumpectomy and sentinel node biopsy (1 negative node) on 10/15/2020 It is ER/PR positive  and HER2 negative with a Ki67 of 10%. PAIN:  Are you having pain? No  PRECAUTIONS: Other: at risk for lymphedema on R  WEIGHT BEARING RESTRICTIONS No  FALLS:  Has patient fallen in last 6 months? No  LIVING ENVIRONMENT: Lives with: lives alone Lives in: House/apartment Stairs: No;  Has following equipment at home: None  OCCUPATION: full time at [employer], part at [employer]: pt reports she does not exercise like she should  HAND DOMINANCE : right   PRIOR LEVEL OF FUNCTION: Independent  PATIENT GOALS to not have to wear the sleeve and to get rid of the swelling   OBJECTIVE  COGNITION:  Overall cognitive status: Within functional limits for tasks assessed   OBSERVATIONS / OTHER ASSESSMENTS: no observable swelling today  POSTURE: forward head, rounded shoulders    LYMPHEDEMA ASSESSMENTS:   SURGERY TYPE/DATE: R lumpectomy and SLNB 10/15/20  NUMBER OF LYMPH NODES REMOVED: 0/1  CHEMOTHERAPY: not required  RADIATION:completed  HORMONE TREATMENT: none  INFECTIONS: none  LYMPHEDEMA ASSESSMENTS:   LANDMARK RIGHT  eval  10 cm proximal to olecranon process 26.2  Olecranon process 23  10 cm proximal to ulnar styloid process 20  Just proximal to ulnar styloid process 14.5  Across hand at thumb web space 17.4  At base of 2nd digit 5.9  (Blank  rows = not tested)  LANDMARK LEFT  eval  10 cm proximal to olecranon process 27.5  Olecranon process 23.4  10 cm proximal to ulnar styloid process 19.1  Just proximal to ulnar styloid process 14.9  Across hand at thumb web space 18.5  At base of 2nd digit 6  (Blank rows = not tested)     TODAY'S TREATMENT  08/01/22:  Self Care: Spent time reviewing basic anatomy of lymphatic system and principles of MLD so pt would have better understanding of MLD sequence Manual Therapy MLD: In supine: Short neck, 5 diaphragmatic breaths, Lt axillary nodes and establishment of anterior inter-axillary pathway, Rt inguinal nodes and establishment of Rt axillo-inguinal pathway, then Rt UE working proximal to distal, moving inner upper arm outwards and upwards, and doing both sides of forearms, and dorsal hand then retracing all steps - instructed pt throughout having her return demo of each step, tactile and VCs for correct pressure multiple times throughout, she did better with correct skin stretch  07/27/22: In supine: Short neck, 5 diaphragmatic breaths, L axillary nodes and establishment of interaxillary pathway, R inguinal nodes and establishment of axilloinguinal pathway, then R UE working proximal to distal, moving inner upper arm outwards and upwards, and doing both sides of forearms,  then retracing all steps - instructed pt throughout in anatomy and physiology of the lymphatic system and basic principles of self MLD including importance of correct skin stretch   PATIENT EDUCATION:  Education details: Self MLD daily Person educated: Patient Education method: Explanation, demonstration tactile and VCs Education comprehension: verbalized understanding, returned demonstration, needs further review   HOME EXERCISE PROGRAM: Wear compression sleeve for 12 hrs a day for the next 4 weeks; self MLD daily  ASSESSMENT:  CLINICAL IMPRESSION: Continued with instruction of MLD to Rt UE having pt return  demo. She required multiple VCs for lighter pressure but did improve well with skin stretch, not sliding on the skin. She will benefit from further review.    OBJECTIVE IMPAIRMENTS increased edema.   ACTIVITY LIMITATIONS  none  PARTICIPATION LIMITATIONS:  none  PERSONAL FACTORS  none  are also affecting patient's functional  outcome.   REHAB POTENTIAL: Excellent  CLINICAL DECISION MAKING: Stable/uncomplicated  EVALUATION COMPLEXITY: Low  GOALS: Goals reviewed with patient? Yes  SHORT TERM GOALS=LONG TERM GOALS  Target date: 08/24/2022    Pt will be independent in self MLD for long term management of edema.  Baseline: Goal status: INITIAL  2.  Pt will return to a lower ldex score in the green to reverse subclinical lymphedema.  Baseline:  Goal status: INITIAL  PLAN: PT FREQUENCY: 2x/week  PT DURATION: 4 weeks  PLANNED INTERVENTIONS: Patient/Family education, Self Care, Manual lymph drainage, Compression bandaging, Taping, and Vasopneumatic device  PLAN FOR NEXT SESSION: Cont to instruct pt in self MLD and have her return demonstrate, once she is indep in self MLD can decrease to 1x/wk, at end of 4 weeks around 10/25 repeat SOZO   Otelia Limes, PTA 08/01/2022, 5:21 PM   Start with circles near the neck, 5 times each side.      Cancer Rehab [phone]  Deep Effective Breath   Standing, sitting, or laying down, place both hands on the belly. Take a deep breath IN, expanding the belly; then breath OUT, contracting the belly. Repeat __5__ times. Do __2-3__ sessions per day and before your self massage.  Axilla to Axilla - Sweep   On uninvolved side make 5 circles in the armpit, then pump _5__ times from involved armpit across chest to uninvolved armpit, making a pathway. Do _1__ time per day.  Copyright  VHI. All rights reserved.  Axilla to Inguinal Nodes - Pump   On involved side, make 5 circles at groin at panty line, then pump _5__ times from  armpit along side of trunk to outer hip, making your other pathway. Do __1_ time per day.  Copyright  VHI. All rights reserved.  Arm Posterior: Elbow to Shoulder - Pump   Pump _5__ times from back of elbow to top of shoulder. Then inner to outer upper arm _5_ times, then outer arm again _5_ times. Then back to the pathways _2-3_ times. Do _1__ time per day.  Copyright  VHI. All rights reserved.  ARM: Volar Wrist to Elbow - Sweep   Pump or stationary circles _5__ times from wrist to elbow making sure to do both sides of the forearm. Then retrace your steps to the outer arm, and the pathways _2-3_ times each. Do _1__ time per day.  Copyright  VHI. All rights reserved.  ARM: Dorsum of Hand to Shoulder - Sweep   Pump or stationary circles _5__ times on back of hand including knuckle spaces and individual fingers if needed working up towards the wrist, then retrace all your steps working back up the forearm, doing both sides; upper outer arm and back to your pathways _2-3_ times each. Then do 5 circles again at uninvolved armpit and involved groin where you started! Good job!! Do __1_ time per day.

## 2022-08-03 ENCOUNTER — Ambulatory Visit: Payer: BC Managed Care – PPO | Admitting: Physical Therapy

## 2022-08-03 ENCOUNTER — Encounter: Payer: Self-pay | Admitting: Physical Therapy

## 2022-08-03 DIAGNOSIS — I890 Lymphedema, not elsewhere classified: Secondary | ICD-10-CM

## 2022-08-03 DIAGNOSIS — C50411 Malignant neoplasm of upper-outer quadrant of right female breast: Secondary | ICD-10-CM

## 2022-08-03 NOTE — Therapy (Signed)
OUTPATIENT PHYSICAL THERAPY ONCOLOGY TREATMENT  Patient Name: Ariel Bell MRN: [id number] DOB:05-Jan-1966, 56 y.o., female Today's Date: 08/03/2022   PT End of Session - 08/03/22 1603     Visit Number 3    Number of Visits 9    Date for PT Re-Evaluation 08/24/22    PT Start Time 1502    PT Stop Time 4765    PT Time Calculation (min) 50 min    Activity Tolerance Patient tolerated treatment well    Behavior During Therapy [facility] for tasks assessed/performed             Past Medical History:  Diagnosis Date   Breast cancer ([place])    Heart murmur    History of radiation therapy 11/19/2020-12/15/2020   Right Breast IMRT; Dr. Gery Pray   Hypertension    Hypothyroidism    Personal history of radiation therapy    Past Surgical History:  Procedure Laterality Date   AXILLARY SENTINEL NODE BIOPSY Right 10/15/2020   Procedure: RIGHT AXILLARY SENTINEL NODE BIOPSY;  Surgeon: Rolm Bookbinder, MD;  Location: [place];  Service: General;  Laterality: Right;   BREAST BIOPSY Right 08/2020   BREAST LUMPECTOMY Right 10/14/2020   BREAST LUMPECTOMY WITH RADIOACTIVE SEED AND SENTINEL LYMPH NODE BIOPSY Right 10/15/2020   Procedure: RIGHT BREAST LUMPECTOMY WITH RADIOACTIVE SEED;  Surgeon: Rolm Bookbinder, MD;  Location: [place];  Service: General;  Laterality: Right;  PEC BLOCK   COLONOSCOPY     WISDOM TOOTH EXTRACTION     Patient Active Problem List   Diagnosis Date Noted   Malignant neoplasm of upper-outer quadrant of right breast in female, estrogen receptor positive ([place]) 10/01/2020    PCP: Arvella Nigh, MD  REFERRING PROVIDER: Benay Pike, MD  REFERRING DIAG: C50.411,Z17.0 (ICD-10-CM) - Malignant neoplasm of upper-outer quadrant of right breast in female, estrogen receptor positive ([place])  THERAPY DIAG:  Lymphedema, not elsewhere classified  Malignant neoplasm of upper-outer quadrant of right female breast, unspecified estrogen receptor status ([place])  ONSET DATE:  05/02/22  Rationale for Evaluation and Treatment Rehabilitation  SUBJECTIVE                                                                                                                                                                                           SUBJECTIVE STATEMENT: I have been trying to self massage.   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 She underwent a right lumpectomy and sentinel node biopsy (1 negative node) on 10/15/2020 It is ER/PR positive and HER2 negative with a Ki67 of 10%. PAIN:  Are you having pain? No  PRECAUTIONS: Other: at risk for lymphedema on R  WEIGHT BEARING RESTRICTIONS No  FALLS:  Has patient fallen in last 6 months? No  LIVING ENVIRONMENT: Lives with: lives alone Lives in: House/apartment Stairs: No;  Has following equipment at home: None  OCCUPATION: full time at [employer], part at [employer]: pt reports she does not exercise like she should  HAND DOMINANCE : right   PRIOR LEVEL OF FUNCTION: Independent  PATIENT GOALS to not have to wear the sleeve and to get rid of the swelling   OBJECTIVE  COGNITION:  Overall cognitive status: Within functional limits for tasks assessed   OBSERVATIONS / OTHER ASSESSMENTS: no observable swelling today  POSTURE: forward head, rounded shoulders    LYMPHEDEMA ASSESSMENTS:   SURGERY TYPE/DATE: R lumpectomy and SLNB 10/15/20  NUMBER OF LYMPH NODES REMOVED: 0/1  CHEMOTHERAPY: not required  RADIATION:completed  HORMONE TREATMENT: none  INFECTIONS: none  LYMPHEDEMA ASSESSMENTS:   LANDMARK RIGHT  eval  10 cm proximal to olecranon process 26.2  Olecranon process 23  10 cm proximal to ulnar styloid process 20  Just proximal to ulnar styloid process 14.5  Across hand at thumb web space 17.4  At base of 2nd digit 5.9  (Blank rows = not tested)  LANDMARK LEFT  eval  10 cm proximal  to olecranon process 27.5  Olecranon process 23.4  10 cm proximal to ulnar styloid process 19.1  Just proximal to ulnar styloid process 14.9  Across hand at thumb web space 18.5  At base of 2nd digit 6  (Blank rows = not tested)     TODAY'S TREATMENT  08/03/22 Manual Therapy MLD: In supine: Short neck, 5 diaphragmatic breaths, Lt axillary nodes and establishment of anterior inter-axillary pathway, Rt inguinal nodes and establishment of Rt axillo-inguinal pathway, then Rt UE working proximal to distal, moving inner upper arm outwards and upwards, and doing both sides of forearms, and dorsal hand then retracing all steps - pt did beginning of MLD and required occasional, tactile and VCs for correct pressure, speed and direction of skin stretch then therapist completed session.   08/01/22:  Self Care: Spent time reviewing basic anatomy of lymphatic system and principles of MLD so pt would have better understanding of MLD sequence Manual Therapy MLD: In supine: Short neck, 5 diaphragmatic breaths, Lt axillary nodes and establishment of anterior inter-axillary pathway, Rt inguinal nodes and establishment of Rt axillo-inguinal pathway, then Rt UE working proximal to distal, moving inner upper arm outwards and upwards, and doing both sides of forearms, and dorsal hand then retracing all steps - instructed pt throughout having her return demo of each step, tactile and VCs for correct pressure multiple times throughout, she did better with correct skin stretch  07/27/22: In supine: Short neck, 5 diaphragmatic breaths, L axillary nodes and establishment of interaxillary pathway, R inguinal nodes and establishment of axilloinguinal pathway, then R UE working proximal to distal, moving inner upper arm outwards and upwards, and doing both sides of forearms,  then retracing all steps - instructed pt throughout in anatomy and physiology of the lymphatic system and basic principles of self MLD including  importance of correct skin stretch   PATIENT EDUCATION:  Education details: Self MLD daily Person educated: Patient Education method: Explanation, demonstration tactile and VCs Education comprehension: verbalized understanding, returned demonstration, needs further review   HOME EXERCISE PROGRAM: Wear compression sleeve for 12 hrs a day for the next 4 weeks; self  MLD daily  ASSESSMENT:  CLINICAL IMPRESSION: Pt is progressing towards independence with self MLD. She required less frequent cues to perform self MLD correctly and by end of session was doing self MLD at appropriate speed and with appropriate skin stretch. Will reassess independence at next session.    OBJECTIVE IMPAIRMENTS increased edema.   ACTIVITY LIMITATIONS  none  PARTICIPATION LIMITATIONS:  none  PERSONAL FACTORS  none  are also affecting patient's functional outcome.   REHAB POTENTIAL: Excellent  CLINICAL DECISION MAKING: Stable/uncomplicated  EVALUATION COMPLEXITY: Low  GOALS: Goals reviewed with patient? Yes  SHORT TERM GOALS=LONG TERM GOALS  Target date: 08/24/2022    Pt will be independent in self MLD for long term management of edema.  Baseline: Goal status: INITIAL  2.  Pt will return to a lower ldex score in the green to reverse subclinical lymphedema.  Baseline:  Goal status: INITIAL  PLAN: PT FREQUENCY: 2x/week  PT DURATION: 4 weeks  PLANNED INTERVENTIONS: Patient/Family education, Self Care, Manual lymph drainage, Compression bandaging, Taping, and Vasopneumatic device  PLAN FOR NEXT SESSION: Cont to instruct pt in self MLD and have her return demonstrate, once she is indep in self MLD can decrease to 1x/wk, at end of 4 weeks around 10/25 repeat SOZO   [facility], PT 08/03/2022, 4:55 PM   Start with circles near the neck, 5 times each side.      Cancer Rehab [phone]  Deep Effective Breath   Standing, sitting, or laying down, place both hands on the belly. Take a  deep breath IN, expanding the belly; then breath OUT, contracting the belly. Repeat __5__ times. Do __2-3__ sessions per day and before your self massage.  Axilla to Axilla - Sweep   On uninvolved side make 5 circles in the armpit, then pump _5__ times from involved armpit across chest to uninvolved armpit, making a pathway. Do _1__ time per day.  Copyright  VHI. All rights reserved.  Axilla to Inguinal Nodes - Pump   On involved side, make 5 circles at groin at panty line, then pump _5__ times from armpit along side of trunk to outer hip, making your other pathway. Do __1_ time per day.  Copyright  VHI. All rights reserved.  Arm Posterior: Elbow to Shoulder - Pump   Pump _5__ times from back of elbow to top of shoulder. Then inner to outer upper arm _5_ times, then outer arm again _5_ times. Then back to the pathways _2-3_ times. Do _1__ time per day.  Copyright  VHI. All rights reserved.  ARM: Volar Wrist to Elbow - Sweep   Pump or stationary circles _5__ times from wrist to elbow making sure to do both sides of the forearm. Then retrace your steps to the outer arm, and the pathways _2-3_ times each. Do _1__ time per day.  Copyright  VHI. All rights reserved.  ARM: Dorsum of Hand to Shoulder - Sweep   Pump or stationary circles _5__ times on back of hand including knuckle spaces and individual fingers if needed working up towards the wrist, then retrace all your steps working back up the forearm, doing both sides; upper outer arm and back to your pathways _2-3_ times each. Then do 5 circles again at uninvolved armpit and involved groin where you started! Good job!! Do __1_ time per day.

## 2022-08-08 ENCOUNTER — Ambulatory Visit: Payer: BC Managed Care – PPO | Attending: General Surgery | Admitting: Physical Therapy

## 2022-08-08 ENCOUNTER — Encounter: Payer: Self-pay | Admitting: Physical Therapy

## 2022-08-08 DIAGNOSIS — I890 Lymphedema, not elsewhere classified: Secondary | ICD-10-CM | POA: Insufficient documentation

## 2022-08-08 DIAGNOSIS — C50411 Malignant neoplasm of upper-outer quadrant of right female breast: Secondary | ICD-10-CM | POA: Diagnosis present

## 2022-08-08 NOTE — Therapy (Signed)
OUTPATIENT PHYSICAL THERAPY ONCOLOGY TREATMENT  Patient Name: Ariel Bell MRN: [id number] DOB:[birth year], 56 y.o., female Today's Date: 08/08/2022   PT End of Session - 08/08/22 1602     Visit Number 4    Number of Visits 9    Date for PT Re-Evaluation 08/24/22    PT Start Time 1602    PT Stop Time 8938    PT Time Calculation (min) 53 min    Activity Tolerance Patient tolerated treatment well    Behavior During Therapy [facility] for tasks assessed/performed              Past Medical History:  Diagnosis Date   Breast cancer ([place])    Heart murmur    History of radiation therapy 11/19/2020-12/15/2020   Right Breast IMRT; Dr. Gery Pray   Hypertension    Hypothyroidism    Personal history of radiation therapy    Past Surgical History:  Procedure Laterality Date   AXILLARY SENTINEL NODE BIOPSY Right 10/15/2020   Procedure: RIGHT AXILLARY SENTINEL NODE BIOPSY;  Surgeon: Rolm Bookbinder, MD;  Location: [facility];  Service: General;  Laterality: Right;   BREAST BIOPSY Right 08/2020   BREAST LUMPECTOMY Right 10/14/2020   BREAST LUMPECTOMY WITH RADIOACTIVE SEED AND SENTINEL LYMPH NODE BIOPSY Right 10/15/2020   Procedure: RIGHT BREAST LUMPECTOMY WITH RADIOACTIVE SEED;  Surgeon: Rolm Bookbinder, MD;  Location: [facility];  Service: General;  Laterality: Right;  PEC BLOCK   COLONOSCOPY     WISDOM TOOTH EXTRACTION     Patient Active Problem List   Diagnosis Date Noted   Malignant neoplasm of upper-outer quadrant of right breast in female, estrogen receptor positive ([place]) 10/01/2020    PCP: Arvella Nigh, MD  REFERRING PROVIDER: Benay Pike, MD  REFERRING DIAG: C50.411,Z17.0 (ICD-10-CM) - Malignant neoplasm of upper-outer quadrant of right breast in female, estrogen receptor positive ([place])  THERAPY DIAG:  Lymphedema, not elsewhere classified  Malignant neoplasm of upper-outer quadrant of right female breast, unspecified estrogen receptor status ([place])  ONSET DATE:  05/02/22  Rationale for Evaluation and Treatment Rehabilitation  SUBJECTIVE                                                                                                                                                                                           SUBJECTIVE STATEMENT: The self massage is ok but I think I am still a little confused.   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 She underwent a right lumpectomy and sentinel node biopsy (1 negative node) on 10/15/2020 It is ER/PR positive and HER2  negative with a Ki67 of 10%. PAIN:  Are you having pain? No  PRECAUTIONS: Other: at risk for lymphedema on R  WEIGHT BEARING RESTRICTIONS No  FALLS:  Has patient fallen in last 6 months? No  LIVING ENVIRONMENT: Lives with: lives alone Lives in: House/apartment Stairs: No;  Has following equipment at home: None  OCCUPATION: full time at [employer], part at [employer]: pt reports she does not exercise like she should  HAND DOMINANCE : right   PRIOR LEVEL OF FUNCTION: Independent  PATIENT GOALS to not have to wear the sleeve and to get rid of the swelling   OBJECTIVE  COGNITION:  Overall cognitive status: Within functional limits for tasks assessed   OBSERVATIONS / OTHER ASSESSMENTS: no observable swelling today  POSTURE: forward head, rounded shoulders    LYMPHEDEMA ASSESSMENTS:   SURGERY TYPE/DATE: R lumpectomy and SLNB 10/15/20  NUMBER OF LYMPH NODES REMOVED: 0/1  CHEMOTHERAPY: not required  RADIATION:completed  HORMONE TREATMENT: none  INFECTIONS: none  LYMPHEDEMA ASSESSMENTS:   LANDMARK RIGHT  eval  10 cm proximal to olecranon process 26.2  Olecranon process 23  10 cm proximal to ulnar styloid process 20  Just proximal to ulnar styloid process 14.5  Across hand at thumb web space 17.4  At base of 2nd digit 5.9  (Blank rows = not tested)  LANDMARK  LEFT  eval  10 cm proximal to olecranon process 27.5  Olecranon process 23.4  10 cm proximal to ulnar styloid process 19.1  Just proximal to ulnar styloid process 14.9  Across hand at thumb web space 18.5  At base of 2nd digit 6  (Blank rows = not tested)     TODAY'S TREATMENT  08/08/22 Manual Therapy MLD: In supine: Short neck, 5 diaphragmatic breaths, Lt axillary nodes and establishment of anterior inter-axillary pathway, Rt inguinal nodes and establishment of Rt axillo-inguinal pathway, then Rt UE working proximal to distal, moving inner upper arm outwards and upwards, and doing both sides of forearms, and dorsal hand then retracing all steps - pt completed MLD for half the session demonstrating entire sequence then therapist completed session. Pt required occasional, tactile and VCs for correct pressure, speed and sequence but is demonstrating increasing independence. STM performed to R upper arm in area of tenderness with increased "knotty" texture noted. Pt reported improved discomfort afterwards.   08/03/22 Manual Therapy MLD: In supine: Short neck, 5 diaphragmatic breaths, Lt axillary nodes and establishment of anterior inter-axillary pathway, Rt inguinal nodes and establishment of Rt axillo-inguinal pathway, then Rt UE working proximal to distal, moving inner upper arm outwards and upwards, and doing both sides of forearms, and dorsal hand then retracing all steps - pt did beginning of MLD and required occasional, tactile and VCs for correct pressure, speed and direction of skin stretch then therapist completed session.   08/01/22:  Self Care: Spent time reviewing basic anatomy of lymphatic system and principles of MLD so pt would have better understanding of MLD sequence Manual Therapy MLD: In supine: Short neck, 5 diaphragmatic breaths, Lt axillary nodes and establishment of anterior inter-axillary pathway, Rt inguinal nodes and establishment of Rt axillo-inguinal pathway, then Rt  UE working proximal to distal, moving inner upper arm outwards and upwards, and doing both sides of forearms, and dorsal hand then retracing all steps - instructed pt throughout having her return demo of each step, tactile and VCs for correct pressure multiple times throughout, she did better with correct skin stretch  PATIENT EDUCATION:  Education details: Self MLD daily Person educated: Patient Education method: Explanation, demonstration tactile and VCs Education comprehension: verbalized understanding, returned demonstration, needs further review   HOME EXERCISE PROGRAM: Wear compression sleeve for 12 hrs a day for the next 4 weeks; self MLD daily  ASSESSMENT:  CLINICAL IMPRESSION: Continued to instruct pt in self MLD and have pt return demonstrate correct technique while therapist provides appropriate cues as needed with pt demonstrating increasing independence with ability to perform self MLD.    OBJECTIVE IMPAIRMENTS increased edema.   ACTIVITY LIMITATIONS  none  PARTICIPATION LIMITATIONS:  none  PERSONAL FACTORS  none  are also affecting patient's functional outcome.   REHAB POTENTIAL: Excellent  CLINICAL DECISION MAKING: Stable/uncomplicated  EVALUATION COMPLEXITY: Low  GOALS: Goals reviewed with patient? Yes  SHORT TERM GOALS=LONG TERM GOALS  Target date: 08/24/2022    Pt will be independent in self MLD for long term management of edema.  Baseline: Goal status: INITIAL  2.  Pt will return to a lower ldex score in the green to reverse subclinical lymphedema.  Baseline:  Goal status: INITIAL  PLAN: PT FREQUENCY: 2x/week  PT DURATION: 4 weeks  PLANNED INTERVENTIONS: Patient/Family education, Self Care, Manual lymph drainage, Compression bandaging, Taping, and Vasopneumatic device  PLAN FOR NEXT SESSION: Cont to instruct pt in self MLD and have her return demonstrate, once she is indep in self MLD can decrease to 1x/wk, at end of 4 weeks around 10/25  repeat SOZO   [facility], PT 08/08/2022, 4:56 PM   Start with circles near the neck, 5 times each side.      Cancer Rehab [phone]  Deep Effective Breath   Standing, sitting, or laying down, place both hands on the belly. Take a deep breath IN, expanding the belly; then breath OUT, contracting the belly. Repeat __5__ times. Do __2-3__ sessions per day and before your self massage.  Axilla to Axilla - Sweep   On uninvolved side make 5 circles in the armpit, then pump _5__ times from involved armpit across chest to uninvolved armpit, making a pathway. Do _1__ time per day.  Copyright  VHI. All rights reserved.  Axilla to Inguinal Nodes - Pump   On involved side, make 5 circles at groin at panty line, then pump _5__ times from armpit along side of trunk to outer hip, making your other pathway. Do __1_ time per day.  Copyright  VHI. All rights reserved.  Arm Posterior: Elbow to Shoulder - Pump   Pump _5__ times from back of elbow to top of shoulder. Then inner to outer upper arm _5_ times, then outer arm again _5_ times. Then back to the pathways _2-3_ times. Do _1__ time per day.  Copyright  VHI. All rights reserved.  ARM: Volar Wrist to Elbow - Sweep   Pump or stationary circles _5__ times from wrist to elbow making sure to do both sides of the forearm. Then retrace your steps to the outer arm, and the pathways _2-3_ times each. Do _1__ time per day.  Copyright  VHI. All rights reserved.  ARM: Dorsum of Hand to Shoulder - Sweep   Pump or stationary circles _5__ times on back of hand including knuckle spaces and individual fingers if needed working up towards the wrist, then retrace all your steps working back up the forearm, doing both sides; upper outer arm and back to your pathways _2-3_ times each. Then do 5 circles again at uninvolved armpit and involved groin where  you started! Good job!! Do __1_ time per day.

## 2022-08-10 ENCOUNTER — Encounter: Payer: Self-pay | Admitting: Physical Therapy

## 2022-08-10 ENCOUNTER — Ambulatory Visit: Payer: BC Managed Care – PPO | Admitting: Physical Therapy

## 2022-08-10 DIAGNOSIS — C50411 Malignant neoplasm of upper-outer quadrant of right female breast: Secondary | ICD-10-CM

## 2022-08-10 DIAGNOSIS — I890 Lymphedema, not elsewhere classified: Secondary | ICD-10-CM | POA: Diagnosis not present

## 2022-08-10 NOTE — Therapy (Signed)
OUTPATIENT PHYSICAL THERAPY ONCOLOGY TREATMENT  Patient Name: Ariel Bell MRN: [id number] DOB:27-Jan-1966, 56 y.o., female Today's Date: 08/10/2022   PT End of Session - 08/10/22 1556     Visit Number 5    Number of Visits 9    Date for PT Re-Evaluation 08/24/22    PT Start Time 1556    PT Stop Time 5361    PT Time Calculation (min) 47 min    Activity Tolerance Patient tolerated treatment well    Behavior During Therapy [facility] for tasks assessed/performed              Past Medical History:  Diagnosis Date   Breast cancer ([place])    Heart murmur    History of radiation therapy 11/19/2020-12/15/2020   Right Breast IMRT; Dr. Gery Pray   Hypertension    Hypothyroidism    Personal history of radiation therapy    Past Surgical History:  Procedure Laterality Date   AXILLARY SENTINEL NODE BIOPSY Right 10/15/2020   Procedure: RIGHT AXILLARY SENTINEL NODE BIOPSY;  Surgeon: Rolm Bookbinder, MD;  Location: [place];  Service: General;  Laterality: Right;   BREAST BIOPSY Right 08/2020   BREAST LUMPECTOMY Right 10/14/2020   BREAST LUMPECTOMY WITH RADIOACTIVE SEED AND SENTINEL LYMPH NODE BIOPSY Right 10/15/2020   Procedure: RIGHT BREAST LUMPECTOMY WITH RADIOACTIVE SEED;  Surgeon: Rolm Bookbinder, MD;  Location: [place];  Service: General;  Laterality: Right;  PEC BLOCK   COLONOSCOPY     WISDOM TOOTH EXTRACTION     Patient Active Problem List   Diagnosis Date Noted   Malignant neoplasm of upper-outer quadrant of right breast in female, estrogen receptor positive ([place]) 10/01/2020    PCP: Arvella Nigh, MD  REFERRING PROVIDER: Benay Pike, MD  REFERRING DIAG: C50.411,Z17.0 (ICD-10-CM) - Malignant neoplasm of upper-outer quadrant of right breast in female, estrogen receptor positive ([place])  THERAPY DIAG:  Lymphedema, not elsewhere classified  Malignant neoplasm of upper-outer quadrant of right female breast, unspecified estrogen receptor status ([place])  ONSET DATE:  05/02/22  Rationale for Evaluation and Treatment Rehabilitation  SUBJECTIVE                                                                                                                                                                                           SUBJECTIVE STATEMENT: I still get confused on the sequence.   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 She underwent a right lumpectomy and sentinel node biopsy (1 negative node) on 10/15/2020 It is ER/PR positive and HER2 negative with a Ki67 of 10%. PAIN:  Are you having pain? No  PRECAUTIONS: Other: at risk for lymphedema on R  WEIGHT BEARING RESTRICTIONS No  FALLS:  Has patient fallen in last 6 months? No  LIVING ENVIRONMENT: Lives with: lives alone Lives in: House/apartment Stairs: No;  Has following equipment at home: None  OCCUPATION: full time at [employer], part at [employer]: pt reports she does not exercise like she should  HAND DOMINANCE : right   PRIOR LEVEL OF FUNCTION: Independent  PATIENT GOALS to not have to wear the sleeve and to get rid of the swelling   OBJECTIVE  COGNITION:  Overall cognitive status: Within functional limits for tasks assessed   OBSERVATIONS / OTHER ASSESSMENTS: no observable swelling today  POSTURE: forward head, rounded shoulders    LYMPHEDEMA ASSESSMENTS:   SURGERY TYPE/DATE: R lumpectomy and SLNB 10/15/20  NUMBER OF LYMPH NODES REMOVED: 0/1  CHEMOTHERAPY: not required  RADIATION:completed  HORMONE TREATMENT: none  INFECTIONS: none  LYMPHEDEMA ASSESSMENTS:   LANDMARK RIGHT  eval  10 cm proximal to olecranon process 26.2  Olecranon process 23  10 cm proximal to ulnar styloid process 20  Just proximal to ulnar styloid process 14.5  Across hand at thumb web space 17.4  At base of 2nd digit 5.9  (Blank rows = not tested)  LANDMARK LEFT  eval  10 cm  proximal to olecranon process 27.5  Olecranon process 23.4  10 cm proximal to ulnar styloid process 19.1  Just proximal to ulnar styloid process 14.9  Across hand at thumb web space 18.5  At base of 2nd digit 6  (Blank rows = not tested)     TODAY'S TREATMENT  08/10/22 Manual Therapy MLD: In supine: Short neck, 5 diaphragmatic breaths, Lt axillary nodes and establishment of anterior inter-axillary pathway, Rt inguinal nodes and establishment of Rt axillo-inguinal pathway, then Rt UE working proximal to distal, moving inner upper arm outwards and upwards, and doing both sides of forearms, and dorsal hand then retracing all steps performed by student PT, Tamia Cofield while educating pt in proper sequence   08/08/22 Manual Therapy MLD: In supine: Short neck, 5 diaphragmatic breaths, Lt axillary nodes and establishment of anterior inter-axillary pathway, Rt inguinal nodes and establishment of Rt axillo-inguinal pathway, then Rt UE working proximal to distal, moving inner upper arm outwards and upwards, and doing both sides of forearms, and dorsal hand then retracing all steps - pt completed MLD for half the session demonstrating entire sequence then therapist completed session. Pt required occasional, tactile and VCs for correct pressure, speed and sequence but is demonstrating increasing independence. STM performed to R upper arm in area of tenderness with increased "knotty" texture noted. Pt reported improved discomfort afterwards.   08/03/22 Manual Therapy MLD: In supine: Short neck, 5 diaphragmatic breaths, Lt axillary nodes and establishment of anterior inter-axillary pathway, Rt inguinal nodes and establishment of Rt axillo-inguinal pathway, then Rt UE working proximal to distal, moving inner upper arm outwards and upwards, and doing both sides of forearms, and dorsal hand then retracing all steps - pt did beginning of MLD and required occasional, tactile and VCs for correct pressure, speed  and direction of skin stretch then therapist completed session.   08/01/22:  Self Care: Spent time reviewing basic anatomy of lymphatic system and principles of MLD so pt would have better understanding of MLD sequence Manual Therapy MLD: In supine: Short neck, 5 diaphragmatic breaths, Lt axillary nodes and establishment of anterior inter-axillary pathway, Rt inguinal nodes and establishment  of Rt axillo-inguinal pathway, then Rt UE working proximal to distal, moving inner upper arm outwards and upwards, and doing both sides of forearms, and dorsal hand then retracing all steps - instructed pt throughout having her return demo of each step, tactile and VCs for correct pressure multiple times throughout, she did better with correct skin stretch     PATIENT EDUCATION:  Education details: Self MLD daily Person educated: Patient Education method: Explanation, demonstration tactile and VCs Education comprehension: verbalized understanding, returned demonstration, needs further review   HOME EXERCISE PROGRAM: Wear compression sleeve for 12 hrs a day for the next 4 weeks; self MLD daily  ASSESSMENT:  CLINICAL IMPRESSION: Continued to instruct pt in self MLD. Pt felt like it would be most beneficial for therapist to go through entire sequence today while reviewing so student PT performed treatment today under supervision of [employer], PT. Pt is feeling more confident with self PT.    OBJECTIVE IMPAIRMENTS increased edema.   ACTIVITY LIMITATIONS  none  PARTICIPATION LIMITATIONS:  none  PERSONAL FACTORS  none  are also affecting patient's functional outcome.   REHAB POTENTIAL: Excellent  CLINICAL DECISION MAKING: Stable/uncomplicated  EVALUATION COMPLEXITY: Low  GOALS: Goals reviewed with patient? Yes  SHORT TERM GOALS=LONG TERM GOALS  Target date: 08/24/2022    Pt will be independent in self MLD for long term management of edema.  Baseline: Goal status:  INITIAL  2.  Pt will return to a lower ldex score in the green to reverse subclinical lymphedema.  Baseline:  Goal status: INITIAL  PLAN: PT FREQUENCY: 2x/week  PT DURATION: 4 weeks  PLANNED INTERVENTIONS: Patient/Family education, Self Care, Manual lymph drainage, Compression bandaging, Taping, and Vasopneumatic device  PLAN FOR NEXT SESSION: Cont to instruct pt in self MLD and have her return demonstrate, once she is indep in self MLD can decrease to 1x/wk, at end of 4 weeks around 10/25 repeat SOZO   [facility], PT 08/10/2022, 4:48 PM   Start with circles near the neck, 5 times each side.      Cancer Rehab [phone]  Deep Effective Breath   Standing, sitting, or laying down, place both hands on the belly. Take a deep breath IN, expanding the belly; then breath OUT, contracting the belly. Repeat __5__ times. Do __2-3__ sessions per day and before your self massage.  Axilla to Axilla - Sweep   On uninvolved side make 5 circles in the armpit, then pump _5__ times from involved armpit across chest to uninvolved armpit, making a pathway. Do _1__ time per day.  Copyright  VHI. All rights reserved.  Axilla to Inguinal Nodes - Pump   On involved side, make 5 circles at groin at panty line, then pump _5__ times from armpit along side of trunk to outer hip, making your other pathway. Do __1_ time per day.  Copyright  VHI. All rights reserved.  Arm Posterior: Elbow to Shoulder - Pump   Pump _5__ times from back of elbow to top of shoulder. Then inner to outer upper arm _5_ times, then outer arm again _5_ times. Then back to the pathways _2-3_ times. Do _1__ time per day.  Copyright  VHI. All rights reserved.  ARM: Volar Wrist to Elbow - Sweep   Pump or stationary circles _5__ times from wrist to elbow making sure to do both sides of the forearm. Then retrace your steps to the outer arm, and the pathways _2-3_ times each. Do _1__ time per day.  Copyright   VHI. All rights reserved.  ARM: Dorsum of Hand to Shoulder - Sweep   Pump or stationary circles _5__ times on back of hand including knuckle spaces and individual fingers if needed working up towards the wrist, then retrace all your steps working back up the forearm, doing both sides; upper outer arm and back to your pathways _2-3_ times each. Then do 5 circles again at uninvolved armpit and involved groin where you started! Good job!! Do __1_ time per day.

## 2022-08-15 ENCOUNTER — Ambulatory Visit: Payer: BC Managed Care – PPO

## 2022-08-15 DIAGNOSIS — C50411 Malignant neoplasm of upper-outer quadrant of right female breast: Secondary | ICD-10-CM

## 2022-08-15 DIAGNOSIS — I890 Lymphedema, not elsewhere classified: Secondary | ICD-10-CM

## 2022-08-15 DIAGNOSIS — Z170 Estrogen receptor positive status [ER+]: Secondary | ICD-10-CM

## 2022-08-15 DIAGNOSIS — Z483 Aftercare following surgery for neoplasm: Secondary | ICD-10-CM

## 2022-08-15 DIAGNOSIS — R293 Abnormal posture: Secondary | ICD-10-CM

## 2022-08-15 NOTE — Therapy (Signed)
OUTPATIENT PHYSICAL THERAPY ONCOLOGY TREATMENT  Patient Name: Ariel Bell MRN: [id number] DOB:04/25/1966, 56 y.o., female Today's Date: 08/15/2022   PT End of Session - 08/15/22 1604     Visit Number 6    Number of Visits 9    Date for PT Re-Evaluation 08/24/22    PT Start Time 1605    PT Stop Time 5170    PT Time Calculation (min) 49 min    Activity Tolerance Patient tolerated treatment well    Behavior During Therapy [facility] for tasks assessed/performed              Past Medical History:  Diagnosis Date   Breast cancer ([place])    Heart murmur    History of radiation therapy 11/19/2020-12/15/2020   Right Breast IMRT; Dr. Gery Pray   Hypertension    Hypothyroidism    Personal history of radiation therapy    Past Surgical History:  Procedure Laterality Date   AXILLARY SENTINEL NODE BIOPSY Right 10/15/2020   Procedure: RIGHT AXILLARY SENTINEL NODE BIOPSY;  Surgeon: Rolm Bookbinder, MD;  Location: [place];  Service: General;  Laterality: Right;   BREAST BIOPSY Right 08/2020   BREAST LUMPECTOMY Right 10/14/2020   BREAST LUMPECTOMY WITH RADIOACTIVE SEED AND SENTINEL LYMPH NODE BIOPSY Right 10/15/2020   Procedure: RIGHT BREAST LUMPECTOMY WITH RADIOACTIVE SEED;  Surgeon: Rolm Bookbinder, MD;  Location: [place];  Service: General;  Laterality: Right;  PEC BLOCK   COLONOSCOPY     WISDOM TOOTH EXTRACTION     Patient Active Problem List   Diagnosis Date Noted   Malignant neoplasm of upper-outer quadrant of right breast in female, estrogen receptor positive ([place]) 10/01/2020    PCP: Arvella Nigh, MD  REFERRING PROVIDER: Benay Pike, MD  REFERRING DIAG: C50.411,Z17.0 (ICD-10-CM) - Malignant neoplasm of upper-outer quadrant of right breast in female, estrogen receptor positive ([place])  THERAPY DIAG:  Lymphedema, not elsewhere classified  Malignant neoplasm of upper-outer quadrant of right female breast, unspecified estrogen receptor status ([place])  Aftercare following  surgery for neoplasm  Malignant neoplasm of upper-outer quadrant of right breast in female, estrogen receptor positive ([place])  Abnormal posture  ONSET DATE: 05/02/22  Rationale for Evaluation and Treatment Rehabilitation  SUBJECTIVE                                                                                                                                                                                           SUBJECTIVE STATEMENT: I have been wearing my sleeve, and doing the Manual drainage, but I still get confused at times.  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 She underwent a right lumpectomy and sentinel node biopsy (1 negative node) on 10/15/2020 It is ER/PR positive and HER2 negative with a Ki67 of 10%. PAIN:  Are you having pain? No  PRECAUTIONS: Other: at risk for lymphedema on R  WEIGHT BEARING RESTRICTIONS No  FALLS:  Has patient fallen in last 6 months? No  LIVING ENVIRONMENT: Lives with: lives alone Lives in: House/apartment Stairs: No;  Has following equipment at home: None  OCCUPATION: full time at [employer], part at [employer]: pt reports she does not exercise like she should  HAND DOMINANCE : right   PRIOR LEVEL OF FUNCTION: Independent  PATIENT GOALS to not have to wear the sleeve and to get rid of the swelling   OBJECTIVE  COGNITION:  Overall cognitive status: Within functional limits for tasks assessed   OBSERVATIONS / OTHER ASSESSMENTS: no observable swelling today  POSTURE: forward head, rounded shoulders    LYMPHEDEMA ASSESSMENTS:   SURGERY TYPE/DATE: R lumpectomy and SLNB 10/15/20  NUMBER OF LYMPH NODES REMOVED: 0/1  CHEMOTHERAPY: not required  RADIATION:completed  HORMONE TREATMENT: none  INFECTIONS: none  LYMPHEDEMA ASSESSMENTS:   LANDMARK RIGHT  eval  10 cm proximal to olecranon process 26.2  Olecranon process 23   10 cm proximal to ulnar styloid process 20  Just proximal to ulnar styloid process 14.5  Across hand at thumb web space 17.4  At base of 2nd digit 5.9  (Blank rows = not tested)  LANDMARK LEFT  eval  10 cm proximal to olecranon process 27.5  Olecranon process 23.4  10 cm proximal to ulnar styloid process 19.1  Just proximal to ulnar styloid process 14.9  Across hand at thumb web space 18.5  At base of 2nd digit 6  (Blank rows = not tested)     TODAY'S TREATMENT   08/15/2022 SELF CARE Sitting while clothed discussed and reviewed/practiced  sequence while therapist read instructions to her and pt performed. Following that therapist performed Manual treatment in supine with verbalization to pt with emphasis on inner to outer arm, and pt making sense of the sequence. Pt then demonstrated technique and sequence again in sitting following manual therapy by therapist Manual Therapy MLD: In supine: Short neck, 5 diaphragmatic breaths, Lt axillary nodes and establishment of anterior inter-axillary pathway, Rt inguinal nodes and establishment of Rt axillo-inguinal pathway, then Rt UE working proximal to distal, moving inner upper arm outwards and upwards, and doing both sides of forearms. Pt then practiced in sitting. Pt given written instructions  08/10/22 Manual Therapy MLD: In supine: Short neck, 5 diaphragmatic breaths, Lt axillary nodes and establishment of anterior inter-axillary pathway, Rt inguinal nodes and establishment of Rt axillo-inguinal pathway, then Rt UE working proximal to distal, moving inner upper arm outwards and upwards, and doing both sides of forearms, and dorsal hand then retracing all steps performed by student PT, Tamia Cofield while educating pt in proper sequence   08/08/22 Manual Therapy MLD: In supine: Short neck, 5 diaphragmatic breaths, Lt axillary nodes and establishment of anterior inter-axillary pathway, Rt inguinal nodes and establishment of Rt  axillo-inguinal pathway, then Rt UE working proximal to distal, moving inner upper arm outwards and upwards, and doing both sides of forearms, and dorsal hand then retracing all steps - pt completed MLD for half the session demonstrating entire sequence then therapist completed session. Pt required occasional, tactile and VCs for correct pressure, speed and sequence but is demonstrating increasing independence.  STM performed to R upper arm in area of tenderness with increased "knotty" texture noted. Pt reported improved discomfort afterwards.   08/03/22 Manual Therapy MLD: In supine: Short neck, 5 diaphragmatic breaths, Lt axillary nodes and establishment of anterior inter-axillary pathway, Rt inguinal nodes and establishment of Rt axillo-inguinal pathway, then Rt UE working proximal to distal, moving inner upper arm outwards and upwards, and doing both sides of forearms, and dorsal hand then retracing all steps - pt did beginning of MLD and required occasional, tactile and VCs for correct pressure, speed and direction of skin stretch then therapist completed session.   08/01/22:  Self Care: Spent time reviewing basic anatomy of lymphatic system and principles of MLD so pt would have better understanding of MLD sequence Manual Therapy MLD: In supine: Short neck, 5 diaphragmatic breaths, Lt axillary nodes and establishment of anterior inter-axillary pathway, Rt inguinal nodes and establishment of Rt axillo-inguinal pathway, then Rt UE working proximal to distal, moving inner upper arm outwards and upwards, and doing both sides of forearms, and dorsal hand then retracing all steps - instructed pt throughout having her return demo of each step, tactile and VCs for correct pressure multiple times throughout, she did better with correct skin stretch     PATIENT EDUCATION:  Education details: Self MLD daily Person educated: Patient Education method: Explanation, demonstration tactile and VCs Education  comprehension: verbalized understanding, returned demonstration, needs further review   HOME EXERCISE PROGRAM: Wear compression sleeve for 12 hrs a day for the next 4 weeks; self MLD daily  ASSESSMENT:  CLINICAL IMPRESSION: Continued to instruct pt in self MLD. Pt practiced in sitting with instructions read by therapist, then pt in supine while therapist performed MLD, and again with pt in sitting to review sequence. She still gets confused some with inner to outer upper arm, and in the forearm going around the forearm instead of up, but is improving overall. Still needs VC and visual cues for proper stretch.   OBJECTIVE IMPAIRMENTS increased edema.   ACTIVITY LIMITATIONS  none  PARTICIPATION LIMITATIONS:  none  PERSONAL FACTORS  none  are also affecting patient's functional outcome.   REHAB POTENTIAL: Excellent  CLINICAL DECISION MAKING: Stable/uncomplicated  EVALUATION COMPLEXITY: Low  GOALS: Goals reviewed with patient? Yes  SHORT TERM GOALS=LONG TERM GOALS  Target date: 08/24/2022    Pt will be independent in self MLD for long term management of edema.  Baseline: Goal status: INITIAL  2.  Pt will return to a lower ldex score in the green to reverse subclinical lymphedema.  Baseline:  Goal status: INITIAL  PLAN: PT FREQUENCY: 2x/week  PT DURATION: 4 weeks  PLANNED INTERVENTIONS: Patient/Family education, Self Care, Manual lymph drainage, Compression bandaging, Taping, and Vasopneumatic device  PLAN FOR NEXT SESSION: Cont to instruct pt in self MLD and have her return demonstrate, once she is indep in self MLD can decrease to 1x/wk, at end of 4 weeks around 10/25 repeat [place], PT 08/15/2022, 5:07 PM   Start with circles near the neck, 5 times each side.      Cancer Rehab [phone]  Deep Effective Breath   Standing, sitting, or laying down, place both hands on the belly. Take a deep breath IN, expanding the belly; then breath OUT,  contracting the belly. Repeat __5__ times. Do __2-3__ sessions per day and before your self massage.  Axilla to Axilla - Sweep   On uninvolved side make 5 circles in the armpit, then pump _5__  times from involved armpit across chest to uninvolved armpit, making a pathway. Do _1__ time per day.  Copyright  VHI. All rights reserved.  Axilla to Inguinal Nodes - Pump   On involved side, make 5 circles at groin at panty line, then pump _5__ times from armpit along side of trunk to outer hip, making your other pathway. Do __1_ time per day.  Copyright  VHI. All rights reserved.  Arm Posterior: Elbow to Shoulder - Pump   Pump _5__ times from back of elbow to top of shoulder. Then inner to outer upper arm _5_ times, then outer arm again _5_ times. Then back to the pathways _2-3_ times. Do _1__ time per day.  Copyright  VHI. All rights reserved.  ARM: Volar Wrist to Elbow - Sweep   Pump or stationary circles _5__ times from wrist to elbow making sure to do both sides of the forearm. Then retrace your steps to the outer arm, and the pathways _2-3_ times each. Do _1__ time per day.  Copyright  VHI. All rights reserved.  ARM: Dorsum of Hand to Shoulder - Sweep   Pump or stationary circles _5__ times on back of hand including knuckle spaces and individual fingers if needed working up towards the wrist, then retrace all your steps working back up the forearm, doing both sides; upper outer arm and back to your pathways _2-3_ times each. Then do 5 circles again at uninvolved armpit and involved groin where you started! Good job!! Do __1_ time per day.

## 2022-08-17 ENCOUNTER — Ambulatory Visit: Payer: BC Managed Care – PPO

## 2022-08-17 DIAGNOSIS — I890 Lymphedema, not elsewhere classified: Secondary | ICD-10-CM

## 2022-08-17 DIAGNOSIS — R293 Abnormal posture: Secondary | ICD-10-CM

## 2022-08-17 DIAGNOSIS — Z483 Aftercare following surgery for neoplasm: Secondary | ICD-10-CM

## 2022-08-17 DIAGNOSIS — C50411 Malignant neoplasm of upper-outer quadrant of right female breast: Secondary | ICD-10-CM

## 2022-08-17 NOTE — Therapy (Signed)
OUTPATIENT PHYSICAL THERAPY ONCOLOGY TREATMENT  Patient Name: Ariel Bell MRN: [id number] DOB:[birth year], 56 y.o., female Today's Date: 08/17/2022   PT End of Session - 08/17/22 1601     Visit Number 7    Number of Visits 9    Date for PT Re-Evaluation 08/24/22    PT Start Time 1602    PT Stop Time 1700    PT Time Calculation (min) 58 min    Activity Tolerance Patient tolerated treatment well    Behavior During Therapy [facility] for tasks assessed/performed              Past Medical History:  Diagnosis Date   Breast cancer ([place])    Heart murmur    History of radiation therapy 11/19/2020-12/15/2020   Right Breast IMRT; Dr. Gery Pray   Hypertension    Hypothyroidism    Personal history of radiation therapy    Past Surgical History:  Procedure Laterality Date   AXILLARY SENTINEL NODE BIOPSY Right 10/15/2020   Procedure: RIGHT AXILLARY SENTINEL NODE BIOPSY;  Surgeon: Rolm Bookbinder, MD;  Location: [place];  Service: General;  Laterality: Right;   BREAST BIOPSY Right 08/2020   BREAST LUMPECTOMY Right 10/14/2020   BREAST LUMPECTOMY WITH RADIOACTIVE SEED AND SENTINEL LYMPH NODE BIOPSY Right 10/15/2020   Procedure: RIGHT BREAST LUMPECTOMY WITH RADIOACTIVE SEED;  Surgeon: Rolm Bookbinder, MD;  Location: [place];  Service: General;  Laterality: Right;  PEC BLOCK   COLONOSCOPY     WISDOM TOOTH EXTRACTION     Patient Active Problem List   Diagnosis Date Noted   Malignant neoplasm of upper-outer quadrant of right breast in female, estrogen receptor positive ([place]) 10/01/2020    PCP: Arvella Nigh, MD  REFERRING PROVIDER: Benay Pike, MD  REFERRING DIAG: C50.411,Z17.0 (ICD-10-CM) - Malignant neoplasm of upper-outer quadrant of right breast in female, estrogen receptor positive ([place])  THERAPY DIAG:  Lymphedema, not elsewhere classified  Malignant neoplasm of upper-outer quadrant of right female breast, unspecified estrogen receptor status ([place])  Aftercare following  surgery for neoplasm  Malignant neoplasm of upper-outer quadrant of right breast in female, estrogen receptor positive ([place])  Abnormal posture  ONSET DATE: 05/02/22  Rationale for Evaluation and Treatment Rehabilitation  SUBJECTIVE                                                                                                                                                                                           SUBJECTIVE STATEMENT: I haven't tried the MLD yet.  I will try it tonight.I feel a little tender at the lateral breast but I always do  PERTINENT HISTORY:  Patient was diagnosed  on 09/02/2020 with 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 She underwent a right lumpectomy and sentinel node biopsy (1 negative node) on 10/15/2020 It is ER/PR positive and HER2 negative with a Ki67 of 10%. PAIN:  Are you having pain? No  PRECAUTIONS: Other: at risk for lymphedema on R  WEIGHT BEARING RESTRICTIONS No  FALLS:  Has patient fallen in last 6 months? No  LIVING ENVIRONMENT: Lives with: lives alone Lives in: House/apartment Stairs: No;  Has following equipment at home: None  OCCUPATION: full time at [employer], part at [employer]: pt reports she does not exercise like she should  HAND DOMINANCE : right   PRIOR LEVEL OF FUNCTION: Independent  PATIENT GOALS to not have to wear the sleeve and to get rid of the swelling   OBJECTIVE  COGNITION:  Overall cognitive status: Within functional limits for tasks assessed   OBSERVATIONS / OTHER ASSESSMENTS: no observable swelling today  POSTURE: forward head, rounded shoulders    LYMPHEDEMA ASSESSMENTS:   SURGERY TYPE/DATE: R lumpectomy and SLNB 10/15/20  NUMBER OF LYMPH NODES REMOVED: 0/1  CHEMOTHERAPY: not required  RADIATION:completed  HORMONE TREATMENT: none  INFECTIONS: none  LYMPHEDEMA ASSESSMENTS:   LANDMARK RIGHT  eval  10 cm proximal to olecranon process 26.2   Olecranon process 23  10 cm proximal to ulnar styloid process 20  Just proximal to ulnar styloid process 14.5  Across hand at thumb web space 17.4  At base of 2nd digit 5.9  (Blank rows = not tested)  LANDMARK LEFT  eval  10 cm proximal to olecranon process 27.5  Olecranon process 23.4  10 cm proximal to ulnar styloid process 19.1  Just proximal to ulnar styloid process 14.9  Across hand at thumb web space 18.5  At base of 2nd digit 6  (Blank rows = not tested)     TODAY'S TREATMENT   08/17/2022 Showed pt correct height to apply sleeve Instructed LTR with arms outstretched to stretch pectorals Manual Therapy MLD: In supine: Short neck, 5 diaphragmatic breaths, Lt axillary nodes and establishment of anterior inter-axillary pathway, Rt inguinal nodes and establishment of Rt axillo-inguinal pathway, then Rt UE working proximal to distal, moving inner upper arm outwards and upwards, and doing both sides of forearms. Pt then practiced in sitting verbalizing and demonstrating all techniques  08/15/2022 SELF CARE Sitting while clothed discussed and reviewed/practiced  sequence while therapist read instructions to her and pt performed. Following that therapist performed Manual treatment in supine with verbalization to pt with emphasis on inner to outer arm, and pt making sense of the sequence. Pt then demonstrated technique and sequence again in sitting following manual therapy by therapist Manual Therapy MLD: In supine: Short neck, 5 diaphragmatic breaths, Lt axillary nodes and establishment of anterior inter-axillary pathway, Rt inguinal nodes and establishment of Rt axillo-inguinal pathway, then Rt UE working proximal to distal, moving inner upper arm outwards and upwards, and doing both sides of forearms. Pt then practiced in sitting. Pt given written instructions  08/10/22 Manual Therapy MLD: In supine: Short neck, 5 diaphragmatic breaths, Lt axillary nodes and establishment of  anterior inter-axillary pathway, Rt inguinal nodes and establishment of Rt axillo-inguinal pathway, then Rt UE working proximal to distal, moving inner upper arm outwards and upwards, and doing both sides of forearms, and dorsal hand then retracing all steps performed by student PT, Tamia Cofield while educating pt in proper sequence   08/08/22 Manual Therapy MLD: In supine: Short  neck, 5 diaphragmatic breaths, Lt axillary nodes and establishment of anterior inter-axillary pathway, Rt inguinal nodes and establishment of Rt axillo-inguinal pathway, then Rt UE working proximal to distal, moving inner upper arm outwards and upwards, and doing both sides of forearms, and dorsal hand then retracing all steps - pt completed MLD for half the session demonstrating entire sequence then therapist completed session. Pt required occasional, tactile and VCs for correct pressure, speed and sequence but is demonstrating increasing independence. STM performed to R upper arm in area of tenderness with increased "knotty" texture noted. Pt reported improved discomfort afterwards.   08/03/22 Manual Therapy MLD: In supine: Short neck, 5 diaphragmatic breaths, Lt axillary nodes and establishment of anterior inter-axillary pathway, Rt inguinal nodes and establishment of Rt axillo-inguinal pathway, then Rt UE working proximal to distal, moving inner upper arm outwards and upwards, and doing both sides of forearms, and dorsal hand then retracing all steps - pt did beginning of MLD and required occasional, tactile and VCs for correct pressure, speed and direction of skin stretch then therapist completed session.   08/01/22:  Self Care: Spent time reviewing basic anatomy of lymphatic system and principles of MLD so pt would have better understanding of MLD sequence Manual Therapy MLD: In supine: Short neck, 5 diaphragmatic breaths, Lt axillary nodes and establishment of anterior inter-axillary pathway, Rt inguinal nodes and  establishment of Rt axillo-inguinal pathway, then Rt UE working proximal to distal, moving inner upper arm outwards and upwards, and doing both sides of forearms, and dorsal hand then retracing all steps - instructed pt throughout having her return demo of each step, tactile and VCs for correct pressure multiple times throughout, she did better with correct skin stretch     PATIENT EDUCATION:  Education details: Self MLD daily Person educated: Patient Education method: Explanation, demonstration tactile and VCs Education comprehension: verbalized understanding, returned demonstration, needs further review   HOME EXERCISE PROGRAM: Wear compression sleeve for 12 hrs a day for the next 4 weeks; self MLD daily  ASSESSMENT:  CLINICAL IMPRESSION: Continued to instruct pt in self MLD with verbalization while being performed by PT in supine, then pt performed in sitting and verbalized and demonstrated all techniques. She still requires occasional VC's for technique and sequence but greatly improved overall. OBJECTIVE IMPAIRMENTS increased edema.   ACTIVITY LIMITATIONS  none  PARTICIPATION LIMITATIONS:  none  PERSONAL FACTORS  none  are also affecting patient's functional outcome.   REHAB POTENTIAL: Excellent  CLINICAL DECISION MAKING: Stable/uncomplicated  EVALUATION COMPLEXITY: Low  GOALS: Goals reviewed with patient? Yes  SHORT TERM GOALS=LONG TERM GOALS  Target date: 08/24/2022    Pt will be independent in self MLD for long term management of edema.  Baseline: Goal status: INITIAL  2.  Pt will return to a lower ldex score in the green to reverse subclinical lymphedema.  Baseline:  Goal status: INITIAL  PLAN: PT FREQUENCY: 2x/week  PT DURATION: 4 weeks  PLANNED INTERVENTIONS: Patient/Family education, Self Care, Manual lymph drainage, Compression bandaging, Taping, and Vasopneumatic device  PLAN FOR NEXT SESSION: Cont to instruct pt in self MLD and have her return  demonstrate, once she is indep in self MLD can decrease to 1x/wk, at end of 4 weeks around 10/25 repeat [place], PT 08/17/2022, 5:02 PM   Start with circles near the neck, 5 times each side.      Cancer Rehab [phone]  Deep Effective Breath   Standing, sitting, or laying down, place both  hands on the belly. Take a deep breath IN, expanding the belly; then breath OUT, contracting the belly. Repeat __5__ times. Do __2-3__ sessions per day and before your self massage.  Axilla to Axilla - Sweep   On uninvolved side make 5 circles in the armpit, then pump _5__ times from involved armpit across chest to uninvolved armpit, making a pathway. Do _1__ time per day.  Copyright  VHI. All rights reserved.  Axilla to Inguinal Nodes - Pump   On involved side, make 5 circles at groin at panty line, then pump _5__ times from armpit along side of trunk to outer hip, making your other pathway. Do __1_ time per day.  Copyright  VHI. All rights reserved.  Arm Posterior: Elbow to Shoulder - Pump   Pump _5__ times from back of elbow to top of shoulder. Then inner to outer upper arm _5_ times, then outer arm again _5_ times. Then back to the pathways _2-3_ times. Do _1__ time per day.  Copyright  VHI. All rights reserved.  ARM: Volar Wrist to Elbow - Sweep   Pump or stationary circles _5__ times from wrist to elbow making sure to do both sides of the forearm. Then retrace your steps to the outer arm, and the pathways _2-3_ times each. Do _1__ time per day.  Copyright  VHI. All rights reserved.  ARM: Dorsum of Hand to Shoulder - Sweep   Pump or stationary circles _5__ times on back of hand including knuckle spaces and individual fingers if needed working up towards the wrist, then retrace all your steps working back up the forearm, doing both sides; upper outer arm and back to your pathways _2-3_ times each. Then do 5 circles again at uninvolved armpit and involved groin  where you started! Good job!! Do __1_ time per day.

## 2022-08-22 ENCOUNTER — Encounter: Payer: Self-pay | Admitting: Physical Therapy

## 2022-08-22 ENCOUNTER — Ambulatory Visit: Payer: BC Managed Care – PPO | Admitting: Physical Therapy

## 2022-08-22 DIAGNOSIS — C50411 Malignant neoplasm of upper-outer quadrant of right female breast: Secondary | ICD-10-CM

## 2022-08-22 DIAGNOSIS — I890 Lymphedema, not elsewhere classified: Secondary | ICD-10-CM

## 2022-08-22 NOTE — Therapy (Signed)
OUTPATIENT PHYSICAL THERAPY ONCOLOGY TREATMENT  Patient Name: Ariel Bell MRN: [id number] DOB:[birth year], 56 y.o., female Today's Date: 08/22/2022   PT End of Session - 08/22/22 1617     Visit Number 8    Number of Visits 9    Date for PT Re-Evaluation 08/24/22    PT Start Time 1601    PT Stop Time 7116    PT Time Calculation (min) 14 min    Activity Tolerance Patient tolerated treatment well    Behavior During Therapy [facility] for tasks assessed/performed               Past Medical History:  Diagnosis Date   Breast cancer ([place])    Heart murmur    History of radiation therapy 11/19/2020-12/15/2020   Right Breast IMRT; Dr. Gery Pray   Hypertension    Hypothyroidism    Personal history of radiation therapy    Past Surgical History:  Procedure Laterality Date   AXILLARY SENTINEL NODE BIOPSY Right 10/15/2020   Procedure: RIGHT AXILLARY SENTINEL NODE BIOPSY;  Surgeon: Rolm Bookbinder, MD;  Location: [place];  Service: General;  Laterality: Right;   BREAST BIOPSY Right 08/2020   BREAST LUMPECTOMY Right 10/14/2020   BREAST LUMPECTOMY WITH RADIOACTIVE SEED AND SENTINEL LYMPH NODE BIOPSY Right 10/15/2020   Procedure: RIGHT BREAST LUMPECTOMY WITH RADIOACTIVE SEED;  Surgeon: Rolm Bookbinder, MD;  Location: [place];  Service: General;  Laterality: Right;  PEC BLOCK   COLONOSCOPY     WISDOM TOOTH EXTRACTION     Patient Active Problem List   Diagnosis Date Noted   Malignant neoplasm of upper-outer quadrant of right breast in female, estrogen receptor positive ([place]) 10/01/2020    PCP: Arvella Nigh, MD  REFERRING PROVIDER: Benay Pike, MD  REFERRING DIAG: C50.411,Z17.0 (ICD-10-CM) - Malignant neoplasm of upper-outer quadrant of right breast in female, estrogen receptor positive ([place])  THERAPY DIAG:  Lymphedema, not elsewhere classified  Malignant neoplasm of upper-outer quadrant of right female breast, unspecified estrogen receptor status ([place])  ONSET DATE:  05/02/22  Rationale for Evaluation and Treatment Rehabilitation  SUBJECTIVE                                                                                                                                                                                           SUBJECTIVE STATEMENT: I haven't tried the MLD yet.  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 She underwent a right lumpectomy and sentinel node biopsy (1  negative node) on 10/15/2020 It is ER/PR positive and HER2 negative with a Ki67 of 10%. PAIN:  Are you having pain? No  PRECAUTIONS: Other: at risk for lymphedema on R  WEIGHT BEARING RESTRICTIONS No  FALLS:  Has patient fallen in last 6 months? No  LIVING ENVIRONMENT: Lives with: lives alone Lives in: House/apartment Stairs: No;  Has following equipment at home: None  OCCUPATION: full time at [employer], part at [employer]: pt reports she does not exercise like she should  HAND DOMINANCE : right   PRIOR LEVEL OF FUNCTION: Independent  PATIENT GOALS to not have to wear the sleeve and to get rid of the swelling   OBJECTIVE  COGNITION:  Overall cognitive status: Within functional limits for tasks assessed   OBSERVATIONS / OTHER ASSESSMENTS: no observable swelling today  POSTURE: forward head, rounded shoulders    LYMPHEDEMA ASSESSMENTS:   SURGERY TYPE/DATE: R lumpectomy and SLNB 10/15/20  NUMBER OF LYMPH NODES REMOVED: 0/1  CHEMOTHERAPY: not required  RADIATION:completed  HORMONE TREATMENT: none  INFECTIONS: none  LYMPHEDEMA ASSESSMENTS:   LANDMARK RIGHT  eval  10 cm proximal to olecranon process 26.2  Olecranon process 23  10 cm proximal to ulnar styloid process 20  Just proximal to ulnar styloid process 14.5  Across hand at thumb web space 17.4  At base of 2nd  digit 5.9  (Blank rows = not tested)  LANDMARK LEFT  eval  10 cm proximal to olecranon process 27.5  Olecranon process 23.4  10 cm proximal to ulnar styloid process 19.1  Just proximal to ulnar styloid process 14.9  Across hand at thumb web space 18.5  At base of 2nd digit 6  (Blank rows = not tested)     TODAY'S TREATMENT  08/22/22 Self care- educated pt that her number has returned to the green for her l dex and that she can stop wearing the sleeve during waking hours and just wear it for activites where she uses her arm a lot such as exercise or lifting at work. Will return to regular l-dex screenings every 3 months.  08/17/2022 Showed pt correct height to apply sleeve Instructed LTR with arms outstretched to stretch pectorals Manual Therapy MLD: In supine: Short neck, 5 diaphragmatic breaths, Lt axillary nodes and establishment of anterior inter-axillary pathway, Rt inguinal nodes and establishment of Rt axillo-inguinal pathway, then Rt UE working proximal to distal, moving inner upper arm outwards and upwards, and doing both sides of forearms. Pt then practiced in sitting verbalizing and demonstrating all techniques  08/15/2022 SELF CARE Sitting while clothed discussed and reviewed/practiced  sequence while therapist read instructions to her and pt performed. Following that therapist performed Manual treatment in supine with verbalization to pt with emphasis on inner to outer arm, and pt making sense of the sequence. Pt then demonstrated technique and sequence again in sitting following manual therapy by therapist Manual Therapy MLD: In supine: Short neck, 5 diaphragmatic breaths, Lt axillary nodes and establishment of anterior inter-axillary pathway, Rt inguinal nodes and establishment of Rt axillo-inguinal pathway, then Rt UE working proximal to distal, moving inner upper arm outwards and upwards, and doing both sides of forearms. Pt then practiced in sitting. Pt given written  instructions  08/10/22 Manual Therapy MLD: In supine: Short neck, 5 diaphragmatic breaths, Lt axillary nodes and establishment of anterior inter-axillary pathway, Rt inguinal nodes and establishment of Rt axillo-inguinal pathway, then Rt UE working proximal to distal, moving inner upper arm outwards and upwards,  and doing both sides of forearms, and dorsal hand then retracing all steps performed by student PT, Tamia Cofield while educating pt in proper sequence   08/08/22 Manual Therapy MLD: In supine: Short neck, 5 diaphragmatic breaths, Lt axillary nodes and establishment of anterior inter-axillary pathway, Rt inguinal nodes and establishment of Rt axillo-inguinal pathway, then Rt UE working proximal to distal, moving inner upper arm outwards and upwards, and doing both sides of forearms, and dorsal hand then retracing all steps - pt completed MLD for half the session demonstrating entire sequence then therapist completed session. Pt required occasional, tactile and VCs for correct pressure, speed and sequence but is demonstrating increasing independence. STM performed to R upper arm in area of tenderness with increased "knotty" texture noted. Pt reported improved discomfort afterwards.   08/03/22 Manual Therapy MLD: In supine: Short neck, 5 diaphragmatic breaths, Lt axillary nodes and establishment of anterior inter-axillary pathway, Rt inguinal nodes and establishment of Rt axillo-inguinal pathway, then Rt UE working proximal to distal, moving inner upper arm outwards and upwards, and doing both sides of forearms, and dorsal hand then retracing all steps - pt did beginning of MLD and required occasional, tactile and VCs for correct pressure, speed and direction of skin stretch then therapist completed session.   08/01/22:  Self Care: Spent time reviewing basic anatomy of lymphatic system and principles of MLD so pt would have better understanding of MLD sequence Manual Therapy MLD: In supine:  Short neck, 5 diaphragmatic breaths, Lt axillary nodes and establishment of anterior inter-axillary pathway, Rt inguinal nodes and establishment of Rt axillo-inguinal pathway, then Rt UE working proximal to distal, moving inner upper arm outwards and upwards, and doing both sides of forearms, and dorsal hand then retracing all steps - instructed pt throughout having her return demo of each step, tactile and VCs for correct pressure multiple times throughout, she did better with correct skin stretch     PATIENT EDUCATION:  Education details: Self MLD daily Person educated: Patient Education method: Explanation, demonstration tactile and VCs Education comprehension: verbalized understanding, returned demonstration, needs further review   HOME EXERCISE PROGRAM: Wear compression sleeve for 12 hrs a day for the next 4 weeks; self MLD daily  ASSESSMENT:  CLINICAL IMPRESSION: Assessed pt's progress towards goals in therapy. She has met all goals for therapy. Reassessed her SOZO score it has returned to a score that is even lower than baseline. She is now in the green. Educated pt to still wear her sleeve for activities that are more demanding on her arm. She will return to SOZO screenings every 3 months. She will be discharged from skilled PT services at this time.  OBJECTIVE IMPAIRMENTS increased edema.   ACTIVITY LIMITATIONS  none  PARTICIPATION LIMITATIONS:  none  PERSONAL FACTORS  none  are also affecting patient's functional outcome.   REHAB POTENTIAL: Excellent  CLINICAL DECISION MAKING: Stable/uncomplicated  EVALUATION COMPLEXITY: Low  GOALS: Goals reviewed with patient? Yes  SHORT TERM GOALS=LONG TERM GOALS  Target date: 08/24/2022    Pt will be independent in self MLD for long term management of edema.  Baseline: Goal status: MET 08/22/22/- pt feels independent with this  2.  Pt will return to a lower ldex score in the green to reverse subclinical lymphedema.  Baseline:   Goal status: MET 08/22/22 - L dex in the green and lower than baseline  PLAN: PT FREQUENCY: 2x/week  PT DURATION: 4 weeks  PLANNED INTERVENTIONS: Patient/Family education, Self Care, Manual lymph drainage, Compression  bandaging, Taping, and Vasopneumatic device  PLAN FOR NEXT SESSION: d/c this visit, cont L dex screenings every 3 months   [employer], PT 08/22/2022, 4:27 PM  PHYSICAL THERAPY DISCHARGE SUMMARY  Visits from Start of Care: 8  Current functional level related to goals / functional outcomes: All goals met   Remaining deficits: None   Education / Equipment: Wear compression sleeve when lifting heavy objects and during exercise   Patient agrees to discharge. Patient goals were met. Patient is being discharged due to meeting the stated rehab goals.   Allyson Sabal [place], [place] 08/22/22 4:32 PM

## 2022-08-24 ENCOUNTER — Other Ambulatory Visit: Payer: Self-pay | Admitting: *Deleted

## 2022-08-24 ENCOUNTER — Inpatient Hospital Stay (HOSPITAL_BASED_OUTPATIENT_CLINIC_OR_DEPARTMENT_OTHER): Payer: BC Managed Care – PPO | Admitting: Hematology and Oncology

## 2022-08-24 ENCOUNTER — Inpatient Hospital Stay: Payer: BC Managed Care – PPO | Attending: Hematology and Oncology

## 2022-08-24 VITALS — BP 117/70 | HR 62 | Temp 98.6°F | Resp 16 | Ht 62.0 in | Wt 147.7 lb

## 2022-08-24 DIAGNOSIS — Z170 Estrogen receptor positive status [ER+]: Secondary | ICD-10-CM

## 2022-08-24 DIAGNOSIS — C50411 Malignant neoplasm of upper-outer quadrant of right female breast: Secondary | ICD-10-CM | POA: Insufficient documentation

## 2022-08-24 DIAGNOSIS — Z79899 Other long term (current) drug therapy: Secondary | ICD-10-CM | POA: Diagnosis not present

## 2022-08-24 DIAGNOSIS — Z923 Personal history of irradiation: Secondary | ICD-10-CM | POA: Diagnosis not present

## 2022-08-24 LAB — CBC WITH DIFFERENTIAL (CANCER CENTER ONLY)
Abs Immature Granulocytes: 0.01 10*3/uL (ref 0.00–0.07)
Basophils Absolute: 0 10*3/uL (ref 0.0–0.1)
Basophils Relative: 1 %
Eosinophils Absolute: 0.1 10*3/uL (ref 0.0–0.5)
Eosinophils Relative: 3 %
HCT: 40 % (ref 36.0–46.0)
Hemoglobin: 13.7 g/dL (ref 12.0–15.0)
Immature Granulocytes: 0 %
Lymphocytes Relative: 39 %
Lymphs Abs: 1.6 10*3/uL (ref 0.7–4.0)
MCH: 34.1 pg — ABNORMAL HIGH (ref 26.0–34.0)
MCHC: 34.3 g/dL (ref 30.0–36.0)
MCV: 99.5 fL (ref 80.0–100.0)
Monocytes Absolute: 0.3 10*3/uL (ref 0.1–1.0)
Monocytes Relative: 6 %
Neutro Abs: 2.1 10*3/uL (ref 1.7–7.7)
Neutrophils Relative %: 51 %
Platelet Count: 192 10*3/uL (ref 150–400)
RBC: 4.02 MIL/uL (ref 3.87–5.11)
RDW: 12.5 % (ref 11.5–15.5)
WBC Count: 4.2 10*3/uL (ref 4.0–10.5)
nRBC: 0 % (ref 0.0–0.2)

## 2022-08-24 LAB — CMP (CANCER CENTER ONLY)
ALT: 15 U/L (ref 0–44)
AST: 19 U/L (ref 15–41)
Albumin: 4.3 g/dL (ref 3.5–5.0)
Alkaline Phosphatase: 68 U/L (ref 38–126)
Anion gap: 6 (ref 5–15)
BUN: 15 mg/dL (ref 6–20)
CO2: 30 mmol/L (ref 22–32)
Calcium: 9.2 mg/dL (ref 8.9–10.3)
Chloride: 103 mmol/L (ref 98–111)
Creatinine: 0.78 mg/dL (ref 0.44–1.00)
GFR, Estimated: >60 mL/min (ref >60)
Glucose, Bld: 125 mg/dL — ABNORMAL HIGH (ref 70–99)
Potassium: 3.4 mmol/L — ABNORMAL LOW (ref 3.5–5.1)
Sodium: 139 mmol/L (ref 135–145)
Total Bilirubin: 1.1 mg/dL (ref 0.3–1.2)
Total Protein: 6.9 g/dL (ref 6.5–8.1)

## 2022-08-24 NOTE — Progress Notes (Signed)
[name]  Telephone:([phone] Fax:([phone]) [phone]     ID: Ariel Bell DOB: 1966-06-05  MR#: [id number]  HQP#:[id number]  Patient Care Team: Arvella Nigh, MD as PCP - General (Obstetrics and Gynecology) Mauro Kaufmann, RN as Oncology Nurse Navigator Rockwell Germany, RN as Oncology Nurse Navigator Gery Pray, MD as Consulting Physician (Radiation Oncology) Rolm Bookbinder, MD as Consulting Physician (General Surgery) Ulla Gallo, MD as Consulting Physician (Dermatology) Benay Pike, MD as Consulting Physician (Hematology and Oncology) Benay Pike, MD OTHER MD:  CHIEF COMPLAINT: estrogen receptor positive breast cancer  CURRENT TREATMENT: observation  INTERVAL HISTORY:  Ariel Bell returns today for follow up of her estrogen receptor positive breast cancer.  She is not on any anti estrogen therapy at this time. Recent mammogram in Sep with no evidence of new or recurrent breast cancer. She denies any changes in her health.  Rest of the pertinent 10 point ROS reviewed and negative.    COVID 19 VACCINATION STATUS: Not vaccinated as of 12/22/2020   HISTORY OF CURRENT ILLNESS: From the original intake note:   Ariel Bell had routine screening mammography on 09/02/2020 showing a possible abnormality in the right breast. She underwent right diagnostic mammography with tomography and right breast ultrasonography at [facility] on 09/23/2020 showing: breast density category C; 5 mm mass involving upper-outer right breast with a vague shadowing focus which is likely the sonographic correlate; no pathologic right axillary lymphadenopathy.  Accordingly on 09/28/2020 she proceeded to biopsy of the right breast area in question. The pathology from this procedure ([id number]) showed: invasive and in situ mammary carcinoma, e-cadherin positive, grade 2. Prognostic indicators significant for: estrogen receptor, 100% positive and progesterone receptor,  90% positive, both with strong staining intensity. Proliferation marker Ki67 at 10%. HER2 equivocal by immunohistochemistry (2+), but negative by fluorescent in situ hybridization with a signals ratio 1.41 and number per cell 2.05.  The patient's subsequent history is as detailed below.   PAST MEDICAL HISTORY: Past Medical History:  Diagnosis Date   Breast cancer ([place])    Heart murmur    History of radiation therapy 11/19/2020-12/15/2020   Right Breast IMRT; Dr. Gery Pray   Hypertension    Hypothyroidism    Personal history of radiation therapy     PAST SURGICAL HISTORY: Past Surgical History:  Procedure Laterality Date   AXILLARY SENTINEL NODE BIOPSY Right 10/15/2020   Procedure: RIGHT AXILLARY SENTINEL NODE BIOPSY;  Surgeon: Rolm Bookbinder, MD;  Location: [place];  Service: General;  Laterality: Right;   BREAST BIOPSY Right 08/2020   BREAST LUMPECTOMY Right 10/14/2020   BREAST LUMPECTOMY WITH RADIOACTIVE SEED AND SENTINEL LYMPH NODE BIOPSY Right 10/15/2020   Procedure: RIGHT BREAST LUMPECTOMY WITH RADIOACTIVE SEED;  Surgeon: Rolm Bookbinder, MD;  Location: [place];  Service: General;  Laterality: Right;  PEC BLOCK   COLONOSCOPY     WISDOM TOOTH EXTRACTION      FAMILY HISTORY: Family History  Problem Relation Age of Onset   COPD Father    Her father died in his 44's from COPD/E. Her mother is age 80 as of 09/2020. [name] has two brothers and one sister. There is no family history of cancer to her knowledge.    GYNECOLOGIC HISTORY:  No LMP recorded. Patient is postmenopausal. Menarche: 56 years old [place] P 0 LMP age 49 HRT never used  Hysterectomy? no BSO? 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 She is divorced. She is currently staying with her niece Trinna Post because she just sold her house, but she is planning to move out as soon as she finds her own place to live.    ADVANCED  DIRECTIVES: not in place, plans to name her sister [name] as her HCPOA.   HEALTH MAINTENANCE: Social History   Tobacco Use   Smoking status: Former    Types: Cigarettes    Quit date: 1995    Years since quitting: 28.8   Smokeless tobacco: Never  Vaping Use   Vaping Use: Never used  Substance Use Topics   Alcohol use: Yes    Alcohol/week: 14.0 standard drinks of alcohol    Types: 14 Glasses of wine per week   Drug use: Never     Colonoscopy: 2018?  PAP: up to date  Bone density: osteopenia   No Known Allergies  Current Outpatient Medications  Medication Sig Dispense Refill   cetirizine (ZYRTEC) 10 MG tablet Take 10 mg by mouth daily as needed for allergies.     hydrochlorothiazide (HYDRODIURIL) 25 MG tablet Take 25 mg by mouth daily.     ibandronate (BONIVA) 150 MG tablet Take 150 mg by mouth every 30 (thirty) days.     naproxen sodium (ALEVE) 220 MG tablet Take 220 mg by mouth daily as needed (mild pain).     SYNTHROID 75 MCG tablet Take 75 mcg by mouth daily before breakfast.     traMADol (ULTRAM) 50 MG tablet Take 1 tablet (50 mg total) by mouth every 6 (six) hours as needed. (Patient not taking: Reported on 07/27/2022) 10 tablet 0   No current facility-administered medications for this visit.    OBJECTIVE: White woman who appears well  Vitals:   08/24/22 1257  BP: 117/70  Pulse: 62  Resp: 16  Temp: 98.6 F (37 C)  SpO2: 100%      Body mass index is 27.01 kg/m.   Wt Readings from Last 3 Encounters:  08/24/22 147 lb 11.2 oz (67 kg)  07/25/22 146 lb 2 oz (66.3 kg)  06/13/22 144 lb 4 oz (65.4 kg)      ECOG FS:1 - Symptomatic but completely ambulatory  Physical Exam Constitutional:      Appearance: Normal appearance.  Chest:     Comments: Bilateral breasts inspected. No palpable or regional adenopathy Musculoskeletal:     Cervical back: Normal range of motion. No rigidity.  Lymphadenopathy:     Cervical: No cervical adenopathy.  Neurological:      Mental Status: She is alert.    LAB RESULTS:  CMP     Component Value Date/Time   NA 139 08/24/2022 1153   K 3.4 (L) 08/24/2022 1153   CL 103 08/24/2022 1153   CO2 30 08/24/2022 1153   GLUCOSE 125 (H) 08/24/2022 1153   BUN 15 08/24/2022 1153   CREATININE 0.78 08/24/2022 1153   CALCIUM 9.2 08/24/2022 1153   PROT 6.9 08/24/2022 1153   ALBUMIN 4.3 08/24/2022 1153   AST 19 08/24/2022 1153   ALT 15 08/24/2022 1153   ALKPHOS 68 08/24/2022 1153   BILITOT 1.1 08/24/2022 1153   GFRNONAA >60 08/24/2022 1153    No results found for: "TOTALPROTELP", "ALBUMINELP", "A1GS", "A2GS", "BETS", "BETA2SER", "GAMS", "MSPIKE", "SPEI"  Lab Results  Component Value Date   WBC 4.2 08/24/2022   NEUTROABS 2.1 08/24/2022   HGB 13.7 08/24/2022   HCT 40.0 08/24/2022   MCV 99.5 08/24/2022   PLT 192 08/24/2022  No results found for: "LABCA2"  No components found for: "UPJSRP594"  No results for input(s): "INR" in the last 168 hours.  No results found for: "LABCA2"  No results found for: "VOP929"  No results found for: "CAN125"  No results found for: "CAN153"  No results found for: "CA2729"  No components found for: "HGQUANT"  No results found for: "CEA1", "CEA" / No results found for: "CEA1", "CEA"   No results found for: "AFPTUMOR"  No results found for: "CHROMOGRNA"  No results found for: "KPAFRELGTCHN", "LAMBDASER", "KAPLAMBRATIO" (kappa/lambda light chains)  No results found for: "HGBA", "HGBA2QUANT", "HGBFQUANT", "HGBSQUAN" (Hemoglobinopathy evaluation)   No results found for: "LDH"  No results found for: "IRON", "TIBC", "IRONPCTSAT" (Iron and TIBC)  No results found for: "FERRITIN"  Urinalysis No results found for: "COLORURINE", "APPEARANCEUR", "LABSPEC", "PHURINE", "GLUCOSEU", "HGBUR", "BILIRUBINUR", "KETONESUR", "PROTEINUR", "UROBILINOGEN", "NITRITE", "LEUKOCYTESUR"   STUDIES: No results found.   ELIGIBLE FOR AVAILABLE RESEARCH PROTOCOL: AET  ASSESSMENT:  55 y.o. [place] woman status post right breast upper outer quadrant biopsy 09/28/2020 for a clinical T1a N0, stage IA invasive ductal carcinoma, grade 2, estrogen and progesterone receptor positive, HER-2 negative, with an MIB-1 of 10%.  (a) the carcinoma measured 0.4 cm on the biopsy  (1) status post right lumpectomy and sentinel lymph node sampling 10/15/2020, showing no residual carcinoma in the breast, final pT1a pN0  (a) a total of one axillary lymph node removed  (2) adjuvant radiation 11/19/2020 through 12/15/2020  Site: Right breast Technique: 3D Total Dose (Gy): 42.72/42.72 Dose per Fx (Gy): 2.67 Completed Fx: 16/16 Beam Energies: 6X, 10X   (3) opted against antiestrogens for prophylaxis   PLAN:  She is here for a follow up on observation No concerning ROS or PE findings. Recent mammogram unremarkable for malignancy. We have discussed about continuing regular exercise and plant based diet. She will RTC in one yr or sooner as needed.  *Total Encounter Time as defined by the Centers for Medicare and Medicaid Services includes, in addition to the face-to-face time of a patient visit (documented in the note above) non-face-to-face time: obtaining and reviewing outside history, ordering and reviewing medications, tests or procedures, care coordination (communications with other health care professionals or caregivers) and documentation in the medical record.

## 2022-08-25 ENCOUNTER — Encounter: Payer: BC Managed Care – PPO | Admitting: Rehabilitation

## 2022-11-21 ENCOUNTER — Ambulatory Visit: Payer: BC Managed Care – PPO | Attending: Hematology and Oncology

## 2022-11-21 VITALS — Wt 145.0 lb

## 2022-11-21 DIAGNOSIS — Z483 Aftercare following surgery for neoplasm: Secondary | ICD-10-CM | POA: Insufficient documentation

## 2022-11-21 NOTE — Therapy (Signed)
  OUTPATIENT PHYSICAL THERAPY SOZO SCREENING NOTE   Patient Name: Ariel Bell MRN: [id number] DOB:[birth year], 57 y.o., female Today's Date: 11/21/2022  PCP: Arvella Nigh, MD REFERRING PROVIDER: Benay Pike, MD   PT End of Session - 11/21/22 1642     Visit Number 8   # unchaged due to screen only   PT Start Time 1640    PT Stop Time 1645    PT Time Calculation (min) 5 min    Activity Tolerance Patient tolerated treatment well    Behavior During Therapy [facility] for tasks assessed/performed             Past Medical History:  Diagnosis Date   Breast cancer ([place])    Heart murmur    History of radiation therapy 11/19/2020-12/15/2020   Right Breast IMRT; Dr. Gery Pray   Hypertension    Hypothyroidism    Personal history of radiation therapy    Past Surgical History:  Procedure Laterality Date   AXILLARY SENTINEL NODE BIOPSY Right 10/15/2020   Procedure: RIGHT AXILLARY SENTINEL NODE BIOPSY;  Surgeon: Rolm Bookbinder, MD;  Location: [place];  Service: General;  Laterality: Right;   BREAST BIOPSY Right 08/2020   BREAST LUMPECTOMY Right 10/14/2020   BREAST LUMPECTOMY WITH RADIOACTIVE SEED AND SENTINEL LYMPH NODE BIOPSY Right 10/15/2020   Procedure: RIGHT BREAST LUMPECTOMY WITH RADIOACTIVE SEED;  Surgeon: Rolm Bookbinder, MD;  Location: [place];  Service: General;  Laterality: Right;  PEC BLOCK   COLONOSCOPY     WISDOM TOOTH EXTRACTION     Patient Active Problem List   Diagnosis Date Noted   Malignant neoplasm of upper-outer quadrant of right breast in female, estrogen receptor positive ([place]) 10/01/2020    REFERRING DIAG: right breast cancer at risk for lymphedema  THERAPY DIAG: Aftercare following surgery for neoplasm  PERTINENT HISTORY: Patient was diagnosed on 09/02/2020 with 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 She underwent a right lumpectomy and sentinel node biopsy (1 negative node) on 10/15/2020 It is ER/PR positive and HER2  negative with a Ki67 of 10%.  PRECAUTIONS: right UE Lymphedema risk, None  SUBJECTIVE: Pt returns for her 3 month L-Dex screen.   PAIN:  Are you having pain? No  SOZO SCREENING:  Patient was assessed today using the SOZO machine to determine the lymphedema index score. This was compared to her baseline score. It was determined that she is NOT within the recommended range when compared to her baseline. Pt will resume wear of her compression sleeve x30 days and return for a SOZO screen then.    L-DEX FLOWSHEETS - 11/21/22 1600       L-DEX LYMPHEDEMA SCREENING   Measurement Type Unilateral    L-DEX MEASUREMENT EXTREMITY Upper Extremity    POSITION  Standing    DOMINANT SIDE Right    At Risk Side Right    BASELINE SCORE (UNILATERAL) -13    L-DEX SCORE (UNILATERAL) -3.9    VALUE CHANGE (UNILAT) 9.1               Otelia Limes, PTA 11/21/2022, 4:45 PM

## 2022-12-26 ENCOUNTER — Ambulatory Visit: Payer: BC Managed Care – PPO | Attending: Hematology and Oncology

## 2022-12-26 VITALS — Wt 144.2 lb

## 2022-12-26 DIAGNOSIS — Z483 Aftercare following surgery for neoplasm: Secondary | ICD-10-CM | POA: Insufficient documentation

## 2022-12-26 NOTE — Therapy (Signed)
  OUTPATIENT PHYSICAL THERAPY SOZO SCREENING NOTE   Patient Name: Ariel Bell MRN: [id number] DOB:09-26-1966, 57 y.o., female Today's Date: 12/26/2022  PCP: Arvella Nigh, MD REFERRING PROVIDER: Benay Pike, MD   PT End of Session - 12/26/22 1634     Visit Number 8   # unchanged due to screen only   PT Start Time 1632    PT Stop Time 1636    PT Time Calculation (min) 4 min    Activity Tolerance Patient tolerated treatment well    Behavior During Therapy [facility] for tasks assessed/performed             Past Medical History:  Diagnosis Date   Breast cancer ([place])    Heart murmur    History of radiation therapy 11/19/2020-12/15/2020   Right Breast IMRT; Dr. Gery Pray   Hypertension    Hypothyroidism    Personal history of radiation therapy    Past Surgical History:  Procedure Laterality Date   AXILLARY SENTINEL NODE BIOPSY Right 10/15/2020   Procedure: RIGHT AXILLARY SENTINEL NODE BIOPSY;  Surgeon: Rolm Bookbinder, MD;  Location: [place];  Service: General;  Laterality: Right;   BREAST BIOPSY Right 08/2020   BREAST LUMPECTOMY Right 10/14/2020   BREAST LUMPECTOMY WITH RADIOACTIVE SEED AND SENTINEL LYMPH NODE BIOPSY Right 10/15/2020   Procedure: RIGHT BREAST LUMPECTOMY WITH RADIOACTIVE SEED;  Surgeon: Rolm Bookbinder, MD;  Location: [place];  Service: General;  Laterality: Right;  PEC BLOCK   COLONOSCOPY     WISDOM TOOTH EXTRACTION     Patient Active Problem List   Diagnosis Date Noted   Malignant neoplasm of upper-outer quadrant of right breast in female, estrogen receptor positive ([place]) 10/01/2020    REFERRING DIAG: right breast cancer at risk for lymphedema  THERAPY DIAG: Aftercare following surgery for neoplasm  PERTINENT HISTORY: Patient was diagnosed on 09/02/2020 with 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 She underwent a right lumpectomy and sentinel node biopsy (1 negative node) on 10/15/2020 It is ER/PR positive and HER2  negative with a Ki67 of 10%.  PRECAUTIONS: right UE Lymphedema risk, None  SUBJECTIVE: Pt returns for her 3 month L-Dex screen.   PAIN:  Are you having pain? No  SOZO SCREENING:  Patient was assessed today using the SOZO machine to determine the lymphedema index score. This was compared to her baseline score. It was determined that she is NOT within the recommended range when compared to her baseline. Pt will continue wear of her compression sleeve x30 days and return for a SOZO screen then.    L-DEX FLOWSHEETS - 12/26/22 1600       L-DEX LYMPHEDEMA SCREENING   Measurement Type Unilateral    L-DEX MEASUREMENT EXTREMITY Upper Extremity    POSITION  Standing    DOMINANT SIDE Right    At Risk Side Right    BASELINE SCORE (UNILATERAL) -13    L-DEX SCORE (UNILATERAL) -6.3    VALUE CHANGE (UNILAT) 6.7               Otelia Limes, PTA 12/26/2022, 4:36 PM

## 2023-01-23 ENCOUNTER — Ambulatory Visit: Payer: BC Managed Care – PPO | Attending: Hematology and Oncology

## 2023-01-23 VITALS — Wt 143.0 lb

## 2023-01-23 DIAGNOSIS — Z483 Aftercare following surgery for neoplasm: Secondary | ICD-10-CM | POA: Insufficient documentation

## 2023-01-23 NOTE — Therapy (Signed)
OUTPATIENT PHYSICAL THERAPY SOZO SCREENING NOTE   Patient Name: Ariel Bell MRN: [id number] DOB:1966/03/23, 57 y.o., female Today's Date: 01/23/2023  PCP: Arvella Nigh, MD REFERRING PROVIDER: Arvella Nigh, MD   PT End of Session - 01/23/23 1604     Visit Number 8   # unchanged due to screen only   PT Start Time 1602    PT Stop Time 1606    PT Time Calculation (min) 4 min    Activity Tolerance Patient tolerated treatment well    Behavior During Therapy [facility] for tasks assessed/performed             Past Medical History:  Diagnosis Date   Breast cancer ([place])    Heart murmur    History of radiation therapy 11/19/2020-12/15/2020   Right Breast IMRT; Dr. Gery Pray   Hypertension    Hypothyroidism    Personal history of radiation therapy    Past Surgical History:  Procedure Laterality Date   AXILLARY SENTINEL NODE BIOPSY Right 10/15/2020   Procedure: RIGHT AXILLARY SENTINEL NODE BIOPSY;  Surgeon: Rolm Bookbinder, MD;  Location: [place];  Service: General;  Laterality: Right;   BREAST BIOPSY Right 08/2020   BREAST LUMPECTOMY Right 10/14/2020   BREAST LUMPECTOMY WITH RADIOACTIVE SEED AND SENTINEL LYMPH NODE BIOPSY Right 10/15/2020   Procedure: RIGHT BREAST LUMPECTOMY WITH RADIOACTIVE SEED;  Surgeon: Rolm Bookbinder, MD;  Location: [place];  Service: General;  Laterality: Right;  PEC BLOCK   COLONOSCOPY     WISDOM TOOTH EXTRACTION     Patient Active Problem List   Diagnosis Date Noted   Malignant neoplasm of upper-outer quadrant of right breast in female, estrogen receptor positive ([place]) 10/01/2020    REFERRING DIAG: right breast cancer at risk for lymphedema  THERAPY DIAG: Aftercare following surgery for neoplasm  PERTINENT HISTORY: Patient was diagnosed on 09/02/2020 with 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 She underwent a right lumpectomy and sentinel node biopsy (1 negative node) on 10/15/2020 It is ER/PR positive and HER2 negative  with a Ki67 of 10%.  PRECAUTIONS: right UE Lymphedema risk, None  SUBJECTIVE: Pt returns for her 1 month follow up L-Dex screen.   PAIN:  Are you having pain? No  SOZO SCREENING:  Patient was assessed today using the SOZO machine to determine the lymphedema index score. This was compared to her baseline score. It was determined that she is NOT within the recommended range when compared to her baseline. Pt will continue wear of her compression sleeve x30 days and return for a SOZO screen then.   *Pts baseline is out of normal range ( 10 to -10 is average and hers is -13) and her recent scores have been fairly steady so educated that she may just have subclinical lymphedema at this time and she needs to just continue doing what she's been instructed to do. Cont wearing her compression sleeve with increased activity, exercise and MLD. Pt verbalized understanding and will return in 3 months.    L-DEX FLOWSHEETS - 01/23/23 1600       L-DEX LYMPHEDEMA SCREENING   Measurement Type Unilateral    L-DEX MEASUREMENT EXTREMITY Upper Extremity    POSITION  Standing    DOMINANT SIDE Right    At Risk Side Right    BASELINE SCORE (UNILATERAL) -13    L-DEX SCORE (UNILATERAL) -5.9    VALUE CHANGE (UNILAT) 7.1               Collie Siad  Ann, PTA 01/23/2023, 4:08 PM

## 2023-03-24 ENCOUNTER — Encounter: Payer: Self-pay | Admitting: Hematology and Oncology

## 2023-03-24 ENCOUNTER — Inpatient Hospital Stay: Payer: BC Managed Care – PPO | Attending: Hematology and Oncology | Admitting: Hematology and Oncology

## 2023-03-24 VITALS — BP 139/71 | HR 72 | Temp 97.9°F | Resp 18 | Ht 62.0 in | Wt 143.6 lb

## 2023-03-24 DIAGNOSIS — Z170 Estrogen receptor positive status [ER+]: Secondary | ICD-10-CM | POA: Insufficient documentation

## 2023-03-24 DIAGNOSIS — C50411 Malignant neoplasm of upper-outer quadrant of right female breast: Secondary | ICD-10-CM | POA: Insufficient documentation

## 2023-03-24 NOTE — Progress Notes (Signed)
[facility]  [phone] [phone]     ID: Levin Erp DOB: [birth year]  MR#: [id number]  JYN#:[id number]  Patient Care Team: Richardean Chimera, MD as PCP - General (Obstetrics and Gynecology) Pershing Proud, RN as Oncology Nurse Navigator Donnelly Angelica, RN as Oncology Nurse Navigator Antony Blackbird, MD as Consulting Physician (Radiation Oncology) Emelia Loron, MD as Consulting Physician (General Surgery) Bettey Costa, MD as Consulting Physician (Dermatology) Rachel Moulds, MD as Consulting Physician (Hematology and Oncology) Rachel Moulds, MD OTHER MD:  CHIEF COMPLAINT: estrogen receptor positive breast cancer  CURRENT TREATMENT: observation  INTERVAL HISTORY:  Ernest returns today for follow up of her estrogen receptor positive breast cancer.  She is not on any anti estrogen therapy at this time. Recent mammogram in Sep with no evidence of new or recurrent breast cancer. She denies any changes in her health. She denies any breast changes. She is up to date for cancer screening. Rest of the pertinent 10 point ROS reviewed and negative.    COVID 19 VACCINATION STATUS: Not vaccinated as of 12/22/2020   HISTORY OF CURRENT ILLNESS: From the original intake note:   Shanitra Teller had routine screening mammography on 09/02/2020 showing a possible abnormality in the right breast. She underwent right diagnostic mammography with tomography and right breast ultrasonography at [facility] on 09/23/2020 showing: breast density category C; 5 mm mass involving upper-outer right breast with a vague shadowing focus which is likely the sonographic correlate; no pathologic right axillary lymphadenopathy.  Accordingly on 09/28/2020 she proceeded to biopsy of the right breast area in question. The pathology from this procedure ([id number]) showed: invasive and in situ mammary carcinoma, e-cadherin positive, grade 2. Prognostic indicators  significant for: estrogen receptor, 100% positive and progesterone receptor, 90% positive, both with strong staining intensity. Proliferation marker Ki67 at 10%. HER2 equivocal by immunohistochemistry (2+), but negative by fluorescent in situ hybridization with a signals ratio 1.41 and number per cell 2.05.  The patient's subsequent history is as detailed below.   PAST MEDICAL HISTORY: Past Medical History:  Diagnosis Date   Breast cancer (H[facility])    Heart murmur    History of radiation therapy 11/19/2020-12/15/2020   Right Breast IMRT; Dr. Antony Blackbird   Hypertension    Hypothyroidism    Personal history of radiation therapy     PAST SURGICAL HISTORY: Past Surgical History:  Procedure Laterality Date   AXILLARY SENTINEL NODE BIOPSY Right 10/15/2020   Procedure: RIGHT AXILLARY SENTINEL NODE BIOPSY;  Surgeon: Emelia Loron, MD;  Location: [facility] OR;  Service: General;  Laterality: Right;   BREAST BIOPSY Right 08/2020   BREAST LUMPECTOMY Right 10/14/2020   BREAST LUMPECTOMY WITH RADIOACTIVE SEED AND SENTINEL LYMPH NODE BIOPSY Right 10/15/2020   Procedure: RIGHT BREAST LUMPECTOMY WITH RADIOACTIVE SEED;  Surgeon: Emelia Loron, MD;  Location: [facility] OR;  Service: General;  Laterality: Right;  PEC BLOCK   COLONOSCOPY     WISDOM TOOTH EXTRACTION      FAMILY HISTORY: Family History  Problem Relation Age of Onset   COPD Father    Her father died in his 22's from COPD/E. Her mother is age 71 as of 09/2020. Latrease has two brothers and one sister. There is no family history of cancer to her knowledge.    GYNECOLOGIC HISTORY:  No LMP recorded. Patient is postmenopausal. Menarche: 57 years old GX P 0 LMP age 14 HRT never used  Hysterectomy? no BSO? 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 She is divorced. She is currently staying with her niece Foy [place] because she just sold her house, but she is planning to  move out as soon as she finds her own place to live.    ADVANCED DIRECTIVES: not in place, plans to name her sister Asher Muir as her HCPOA.   HEALTH MAINTENANCE: Social History   Tobacco Use   Smoking status: Former    Types: Cigarettes    Quit date: 1995    Years since quitting: 29.4   Smokeless tobacco: Never  Vaping Use   Vaping Use: Never used  Substance Use Topics   Alcohol use: Yes    Alcohol/week: 14.0 standard drinks of alcohol    Types: 14 Glasses of wine per week   Drug use: Never     Colonoscopy: 2018?  PAP: up to date  Bone density: osteopenia   No Known Allergies  Current Outpatient Medications  Medication Sig Dispense Refill   cetirizine (ZYRTEC) 10 MG tablet Take 10 mg by mouth daily as needed for allergies.     hydrochlorothiazide (HYDRODIURIL) 25 MG tablet Take 25 mg by mouth daily.     ibandronate (BONIVA) 150 MG tablet Take 150 mg by mouth every 30 (thirty) days.     naproxen sodium (ALEVE) 220 MG tablet Take 220 mg by mouth daily as needed (mild pain).     SYNTHROID 75 MCG tablet Take 75 mcg by mouth daily before breakfast.     traMADol (ULTRAM) 50 MG tablet Take 1 tablet (50 mg total) by mouth every 6 (six) hours as needed. (Patient not taking: Reported on 07/27/2022) 10 tablet 0   No current facility-administered medications for this visit.    OBJECTIVE: White woman who appears well  Vitals:   03/24/23 0900  BP: 139/71  Pulse: 72  Resp: 18  Temp: 97.9 F (36.6 C)  SpO2: 99%      Body mass index is 26.26 kg/m.   Wt Readings from Last 3 Encounters:  03/24/23 143 lb 9.6 oz (65.1 kg)  01/23/23 143 lb (64.9 kg)  12/26/22 144 lb 4 oz (65.4 kg)      ECOG FS:1 - Symptomatic but completely ambulatory  Physical Exam Constitutional:      Appearance: Normal appearance.  Chest:     Comments: Bilateral breasts inspected.  Right breast with postradiation changes.  No palpable masses or regional adenopathy.  Left breast normal to inspection and  palpation  Musculoskeletal:     Cervical back: Normal range of motion. No rigidity.  Lymphadenopathy:     Cervical: No cervical adenopathy.  Neurological:     Mental Status: She is alert.    LAB RESULTS:  CMP     Component Value Date/Time   NA 139 08/24/2022 1153   K 3.4 (L) 08/24/2022 1153   CL 103 08/24/2022 1153   CO2 30 08/24/2022 1153   GLUCOSE 125 (H) 08/24/2022 1153   BUN 15 08/24/2022 1153   CREATININE 0.78 08/24/2022 1153   CALCIUM 9.2 08/24/2022 1153   PROT 6.9 08/24/2022 1153   ALBUMIN 4.3 08/24/2022 1153   AST 19 08/24/2022 1153   ALT 15 08/24/2022 1153   ALKPHOS 68 08/24/2022 1153   BILITOT 1.1 08/24/2022 1153   GFRNONAA >60 08/24/2022 1153    No results found for: "TOTALPROTELP", "ALBUMINELP", "A1GS", "A2GS", "BETS", "BETA2SER", "GAMS", "MSPIKE", "SPEI"  Lab Results  Component Value Date   WBC 4.2 08/24/2022  NEUTROABS 2.1 08/24/2022   HGB 13.7 08/24/2022   HCT 40.0 08/24/2022   MCV 99.5 08/24/2022   PLT 192 08/24/2022    No results found for: "LABCA2"  No components found for: "YQMVHQ469"  No results for input(s): "INR" in the last 168 hours.  No results found for: "LABCA2"  No results found for: "GEX528"  No results found for: "CAN125"  No results found for: "CAN153"  No results found for: "CA2729"  No components found for: "HGQUANT"  No results found for: "CEA1", "CEA" / No results found for: "CEA1", "CEA"   No results found for: "AFPTUMOR"  No results found for: "CHROMOGRNA"  No results found for: "KPAFRELGTCHN", "LAMBDASER", "KAPLAMBRATIO" (kappa/lambda light chains)  No results found for: "HGBA", "HGBA2QUANT", "HGBFQUANT", "HGBSQUAN" (Hemoglobinopathy evaluation)   No results found for: "LDH"  No results found for: "IRON", "TIBC", "IRONPCTSAT" (Iron and TIBC)  No results found for: "FERRITIN"  Urinalysis No results found for: "COLORURINE", "APPEARANCEUR", "LABSPEC", "PHURINE", "GLUCOSEU", "HGBUR", "BILIRUBINUR",  "KETONESUR", "PROTEINUR", "UROBILINOGEN", "NITRITE", "LEUKOCYTESUR"   STUDIES: No results found.   ELIGIBLE FOR AVAILABLE RESEARCH PROTOCOL: AET  ASSESSMENT: 57 y.o. [place] woman status post right breast upper outer quadrant biopsy 09/28/2020 for a clinical T1a N0, stage IA invasive ductal carcinoma, grade 2, estrogen and progesterone receptor positive, HER-2 negative, with an MIB-1 of 10%.  (a) the carcinoma measured 0.4 cm on the biopsy  (1) status post right lumpectomy and sentinel lymph node sampling 10/15/2020, showing no residual carcinoma in the breast, final pT1a pN0  (a) a total of one axillary lymph node removed  (2) adjuvant radiation 11/19/2020 through 12/15/2020  Site: Right breast Technique: 3D Total Dose (Gy): 42.72/42.72 Dose per Fx (Gy): 2.67 Completed Fx: 16/16 Beam Energies: 6X, 10X   (3) opted against antiestrogens.   PLAN:  She is here for a follow up.  She is doing really well No concerning ROS or PE findings. Recent mammogram unremarkable for malignancy.  Screening mammo, ordered for September 2024 We have discussed about continuing regular exercise and plant based diet.  She is walking about an hour most days of the week with a friend.  She will follow-up with her gynecologist in November of this year hence we will plan to see her back in May 2025. She will RTC in one yr or sooner as needed.  *Total Encounter Time as defined by the Centers for Medicare and Medicaid Services includes, in addition to the face-to-face time of a patient visit (documented in the note above) non-face-to-face time: obtaining and reviewing outside history, ordering and reviewing medications, tests or procedures, care coordination (communications with other health care professionals or caregivers) and documentation in the medical record.

## 2023-03-28 ENCOUNTER — Telehealth: Payer: Self-pay | Admitting: Hematology and Oncology

## 2023-03-28 NOTE — Telephone Encounter (Signed)
Spoke with patient confirming upcoming appointment  

## 2023-04-24 ENCOUNTER — Ambulatory Visit: Payer: BC Managed Care – PPO | Attending: Hematology and Oncology

## 2023-04-24 VITALS — Wt 139.5 lb

## 2023-04-24 DIAGNOSIS — Z483 Aftercare following surgery for neoplasm: Secondary | ICD-10-CM | POA: Insufficient documentation

## 2023-04-24 NOTE — Therapy (Signed)
OUTPATIENT PHYSICAL THERAPY SOZO SCREENING NOTE   Patient Name: Ariel Bell MRN: [id number] DOB:[birth year], 57 y.o., female Today's Date: 04/24/2023  PCP: Richardean Chimera, MD REFERRING PROVIDER: Rachel Moulds, MD   PT End of Session - 04/24/23 1636     Visit Number 8   # unchanged due to screen only   PT Start Time 1635    PT Stop Time 1639    PT Time Calculation (min) 4 min    Activity Tolerance Patient tolerated treatment well    Behavior During Therapy [facility] for tasks assessed/performed             Past Medical History:  Diagnosis Date   Breast cancer ([facility]CC)    Heart murmur    History of radiation therapy 11/19/2020-12/15/2020   Right Breast IMRT; Dr. Antony Blackbird   Hypertension    Hypothyroidism    Personal history of radiation therapy    Past Surgical History:  Procedure Laterality Date   AXILLARY SENTINEL NODE BIOPSY Right 10/15/2020   Procedure: RIGHT AXILLARY SENTINEL NODE BIOPSY;  Surgeon: Emelia Loron, MD;  Location: [facility] OR;  Service: General;  Laterality: Right;   BREAST BIOPSY Right 08/2020   BREAST LUMPECTOMY Right 10/14/2020   BREAST LUMPECTOMY WITH RADIOACTIVE SEED AND SENTINEL LYMPH NODE BIOPSY Right 10/15/2020   Procedure: RIGHT BREAST LUMPECTOMY WITH RADIOACTIVE SEED;  Surgeon: Emelia Loron, MD;  Location: [facility] OR;  Service: General;  Laterality: Right;  PEC BLOCK   COLONOSCOPY     WISDOM TOOTH EXTRACTION     Patient Active Problem List   Diagnosis Date Noted   Malignant neoplasm of upper-outer quadrant of right breast in female, estrogen receptor positive ([facility]) 10/01/2020    REFERRING DIAG: right breast cancer at risk for lymphedema  THERAPY DIAG: Aftercare following surgery for neoplasm  PERTINENT HISTORY: Patient was diagnosed on 09/02/2020 with 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 She underwent a right lumpectomy and sentinel node biopsy (1 negative node) on 10/15/2020 It is ER/PR positive and HER2  negative with a Ki67 of 10%.  PRECAUTIONS: right UE Lymphedema risk, None  SUBJECTIVE: Pt returns for her 3 month follow up L-Dex screen.   PAIN:  Are you having pain? No  SOZO SCREENING:  Patient was assessed today using the SOZO machine to determine the lymphedema index score. This was compared to her baseline score. It was determined that she is NOT within the recommended range when compared to her baseline. She will resume wear of her compression sleeve and return in 1 month. See below.   *Pts baseline is out of normal range ( 10 to -10 is average and hers is -13) and her recent scores have been fairly steady so educated that she may just have subclinical lymphedema at this time and she needs to just continue doing what she's been instructed to do. Reviewed to cont wearing her compression sleeve with increased activity, exercise and MLD as she has decreased in this. She will return on 1 month for follow up since it is summer and we want to make sure it doesn't get worse.    L-DEX FLOWSHEETS - 04/24/23 1600       L-DEX LYMPHEDEMA SCREENING   Measurement Type Unilateral    L-DEX MEASUREMENT EXTREMITY Upper Extremity    POSITION  Standing    DOMINANT SIDE Right    At Risk Side Right    BASELINE SCORE (UNILATERAL) -13    L-DEX SCORE (UNILATERAL) -6.2  VALUE CHANGE (UNILAT) 6.8               Hermenia Bers, PTA 04/24/2023, 4:40 PM

## 2023-05-29 ENCOUNTER — Encounter: Payer: Self-pay | Admitting: Physical Therapy

## 2023-05-29 ENCOUNTER — Ambulatory Visit: Payer: BC Managed Care – PPO | Attending: Hematology and Oncology | Admitting: Physical Therapy

## 2023-05-29 DIAGNOSIS — Z483 Aftercare following surgery for neoplasm: Secondary | ICD-10-CM | POA: Insufficient documentation

## 2023-05-29 NOTE — Therapy (Signed)
  OUTPATIENT PHYSICAL THERAPY SOZO SCREENING NOTE   Patient Name: Ariel Bell MRN: [id number] DOB:1966/07/18, 57 y.o., female Today's Date: 05/29/2023  PCP: Richardean Chimera, MD REFERRING PROVIDER: Rachel Moulds, MD   PT End of Session - 05/29/23 1656     Visit Number 8    PT Start Time 1630    PT Stop Time 1642    PT Time Calculation (min) 12 min    Activity Tolerance Patient tolerated treatment well    Behavior During Therapy [facility] for tasks assessed/performed             Past Medical History:  Diagnosis Date   Breast cancer ([facility])    Heart murmur    History of radiation therapy 11/19/2020-12/15/2020   Right Breast IMRT; Dr. Antony Blackbird   Hypertension    Hypothyroidism    Personal history of radiation therapy    Past Surgical History:  Procedure Laterality Date   AXILLARY SENTINEL NODE BIOPSY Right 10/15/2020   Procedure: RIGHT AXILLARY SENTINEL NODE BIOPSY;  Surgeon: Emelia Loron, MD;  Location: [facility] OR;  Service: General;  Laterality: Right;   BREAST BIOPSY Right 08/2020   BREAST LUMPECTOMY Right 10/14/2020   BREAST LUMPECTOMY WITH RADIOACTIVE SEED AND SENTINEL LYMPH NODE BIOPSY Right 10/15/2020   Procedure: RIGHT BREAST LUMPECTOMY WITH RADIOACTIVE SEED;  Surgeon: Emelia Loron, MD;  Location: [facility] OR;  Service: General;  Laterality: Right;  PEC BLOCK   COLONOSCOPY     WISDOM TOOTH EXTRACTION     Patient Active Problem List   Diagnosis Date Noted   Malignant neoplasm of upper-outer quadrant of right breast in female, estrogen receptor positive ([facility]) 10/01/2020    REFERRING DIAG: right breast cancer at risk for lymphedema  THERAPY DIAG:  Aftercare following surgery for neoplasm  PERTINENT HISTORY: Patient was diagnosed on 09/02/2020 with 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 She underwent a right lumpectomy and sentinel node biopsy (1 negative node) on 10/15/2020 It is ER/PR positive and HER2 negative with a Ki67 of 10%.    PRECAUTIONS: right UE Lymphedema risk  SUBJECTIVE: Here for SOZO screen  PAIN:  Are you having pain? No  SOZO SCREENING: Patient was assessed today using the SOZO machine to determine the lymphedema index score. This was compared to her baseline score. It was determined that she is within the recommended range when compared to her baseline and no further action is needed at this time. She will continue SOZO screenings. These are done every 3 months for 2 years post operatively followed by every 6 months for 2 years, and then annually.   L-DEX FLOWSHEETS - 05/29/23 1600       L-DEX LYMPHEDEMA SCREENING   Measurement Type Unilateral    L-DEX MEASUREMENT EXTREMITY Upper Extremity    POSITION  Standing    DOMINANT SIDE Right    At Risk Side Right    BASELINE SCORE (UNILATERAL) -13    L-DEX SCORE (UNILATERAL) -9    VALUE CHANGE (UNILAT) 4            Isidore Moos [place],  [place] 05/29/23 4:57 PM

## 2023-07-21 ENCOUNTER — Ambulatory Visit
Admission: RE | Admit: 2023-07-21 | Discharge: 2023-07-21 | Disposition: A | Discharge status: Home / Self Care | Payer: BC Managed Care – PPO | Source: Ambulatory Visit | Attending: Hematology and Oncology

## 2023-07-21 DIAGNOSIS — C50411 Malignant neoplasm of upper-outer quadrant of right female breast: Secondary | ICD-10-CM

## 2023-09-15 ENCOUNTER — Other Ambulatory Visit (HOSPITAL_COMMUNITY): Payer: Self-pay | Admitting: Obstetrics and Gynecology

## 2023-09-15 DIAGNOSIS — R011 Cardiac murmur, unspecified: Secondary | ICD-10-CM

## 2023-09-22 ENCOUNTER — Ambulatory Visit (HOSPITAL_COMMUNITY)
Admission: RE | Admit: 2023-09-22 | Discharge: 2023-09-22 | Disposition: A | Discharge status: Home / Self Care | Payer: BC Managed Care – PPO | Source: Ambulatory Visit | Attending: Obstetrics and Gynecology | Admitting: Obstetrics and Gynecology

## 2023-09-22 DIAGNOSIS — I10 Essential (primary) hypertension: Secondary | ICD-10-CM | POA: Diagnosis not present

## 2023-09-22 DIAGNOSIS — R011 Cardiac murmur, unspecified: Secondary | ICD-10-CM | POA: Diagnosis present

## 2023-09-22 LAB — ECHOCARDIOGRAM COMPLETE
AR max vel: 2.13 cm2
AV Area VTI: 1.88 cm2
AV Area mean vel: 1.87 cm2
AV Mean grad: 5 mm[Hg]
AV Peak grad: 8.9 mm[Hg]
Ao pk vel: 1.49 m/s
Area-P 1/2: 4.19 cm2
MV VTI: 2.13 cm2
S' Lateral: 3.1 cm

## 2023-09-22 NOTE — Progress Notes (Signed)
  Echocardiogram 2D Echocardiogram has been performed.  Ariel Bell 09/22/2023, 10:44 AM

## 2023-11-13 ENCOUNTER — Ambulatory Visit: Payer: BC Managed Care – PPO | Attending: Hematology and Oncology

## 2023-11-13 VITALS — Wt 142.2 lb

## 2023-11-13 DIAGNOSIS — Z483 Aftercare following surgery for neoplasm: Secondary | ICD-10-CM | POA: Insufficient documentation

## 2023-11-13 NOTE — Therapy (Signed)
  OUTPATIENT PHYSICAL THERAPY SOZO SCREENING NOTE   Patient Name: Ariel Bell MRN: [id number] DOB:1966/01/22, 57 y.o., female Today's Date: 11/13/2023  PCP: Leva Rush, MD REFERRING PROVIDER: Loretha Ash, MD   PT End of Session - 11/13/23 1638     Visit Number 8   # unchanged due to screen only   PT Start Time 1637    PT Stop Time 1641    PT Time Calculation (min) 4 min    Activity Tolerance Patient tolerated treatment well    Behavior During Therapy [facility] for tasks assessed/performed             Past Medical History:  Diagnosis Date   Breast cancer (HCC)    Heart murmur    History of radiation therapy 11/19/2020-12/15/2020   Right Breast IMRT; Dr. Lynwood Nasuti   Hypertension    Hypothyroidism    Personal history of radiation therapy    Past Surgical History:  Procedure Laterality Date   AXILLARY SENTINEL NODE BIOPSY Right 10/15/2020   Procedure: RIGHT AXILLARY SENTINEL NODE BIOPSY;  Surgeon: Ebbie Cough, MD;  Location: [facility] OR;  Service: General;  Laterality: Right;   BREAST BIOPSY Right 08/2020   BREAST LUMPECTOMY Right 10/14/2020   BREAST LUMPECTOMY WITH RADIOACTIVE SEED AND SENTINEL LYMPH NODE BIOPSY Right 10/15/2020   Procedure: RIGHT BREAST LUMPECTOMY WITH RADIOACTIVE SEED;  Surgeon: Ebbie Cough, MD;  Location: [facility] OR;  Service: General;  Laterality: Right;  PEC BLOCK   COLONOSCOPY     WISDOM TOOTH EXTRACTION     Patient Active Problem List   Diagnosis Date Noted   Malignant neoplasm of upper-outer quadrant of right breast in female, estrogen receptor positive (HCC) 10/01/2020    REFERRING DIAG: right breast cancer at risk for lymphedema  THERAPY DIAG:  Aftercare following surgery for neoplasm  PERTINENT HISTORY: Patient was diagnosed on 09/02/2020 with 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 She underwent a right lumpectomy and sentinel node biopsy (1 negative node) on 10/15/2020 It is ER/PR positive and HER2  negative with a Ki67 of 10%.   PRECAUTIONS: right UE Lymphedema risk  SUBJECTIVE: Here for SOZO screen  PAIN:  Are you having pain? No  SOZO SCREENING: Patient was assessed today using the SOZO machine to determine the lymphedema index score. This was compared to her baseline score. It was determined that she is within the recommended range when compared to her baseline and no further action is needed at this time. She will continue SOZO screenings. These are done every 3 months for 2 years post operatively followed by every 6 months for 2 years, and then annually.   L-DEX FLOWSHEETS - 11/13/23 1600       L-DEX LYMPHEDEMA SCREENING   Measurement Type Unilateral    L-DEX MEASUREMENT EXTREMITY Upper Extremity    POSITION  Standing    DOMINANT SIDE Right    At Risk Side Right    BASELINE SCORE (UNILATERAL) -13    L-DEX SCORE (UNILATERAL) -7.8    VALUE CHANGE (UNILAT) 5.2             Berwyn Knights, PTA 11/13/23 4:40 PM

## 2023-11-16 ENCOUNTER — Telehealth: Payer: Self-pay

## 2023-11-16 NOTE — Telephone Encounter (Signed)
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 Suggested to pt that she have a co worker help her get the monitor to her car and possibly a friend or neighbor could help her get it in her house and set it up. Also advised her to wear her compression sleeve when moving he monitor and to perform self MLD that day as well. If she feels she has any issues she can call us for another SOZO to assess. Pt verbalized understanding all.

## 2024-03-27 ENCOUNTER — Telehealth: Payer: Self-pay

## 2024-03-27 NOTE — Telephone Encounter (Signed)
 Left a voicemail to confirm appointment for 5/29

## 2024-03-28 ENCOUNTER — Inpatient Hospital Stay: Payer: BC Managed Care – PPO | Attending: Hematology and Oncology | Admitting: Hematology and Oncology

## 2024-03-28 VITALS — BP 129/67 | HR 74 | Temp 98.9°F | Resp 17 | Ht 62.0 in | Wt 140.9 lb

## 2024-03-28 DIAGNOSIS — Z853 Personal history of malignant neoplasm of breast: Secondary | ICD-10-CM | POA: Insufficient documentation

## 2024-03-28 DIAGNOSIS — M810 Age-related osteoporosis without current pathological fracture: Secondary | ICD-10-CM | POA: Diagnosis not present

## 2024-03-28 DIAGNOSIS — Z87891 Personal history of nicotine dependence: Secondary | ICD-10-CM | POA: Insufficient documentation

## 2024-03-28 DIAGNOSIS — C50411 Malignant neoplasm of upper-outer quadrant of right female breast: Secondary | ICD-10-CM

## 2024-03-28 DIAGNOSIS — Z923 Personal history of irradiation: Secondary | ICD-10-CM | POA: Insufficient documentation

## 2024-03-28 DIAGNOSIS — Z170 Estrogen receptor positive status [ER+]: Secondary | ICD-10-CM

## 2024-03-28 NOTE — Progress Notes (Signed)
 [facility]  Telephone:([phone]) [phone] [phone]) [phone]     ID: Ariel Bell DOB: Jul 21, 1966  MR#: [id number]  YQM#:[id number]  Patient Care Team: Merryl Abraham, MD as PCP - General (Obstetrics and Gynecology) Auther Bo, RN as Oncology Nurse Navigator Alane Hsu, RN as Oncology Nurse Navigator Retta Caster, MD as Consulting Physician (Radiation Oncology) Enid Harry, MD as Consulting Physician (General Surgery) Aleen Huron, MD as Consulting Physician (Dermatology) Murleen Arms, MD as Consulting Physician (Hematology and Oncology) Murleen Arms, MD OTHER MD:  CHIEF COMPLAINT: estrogen receptor positive breast cancer  CURRENT TREATMENT: observation  INTERVAL HISTORY:  Discussed the use of AI scribe software for clinical note transcription with the patient, who gave verbal consent to proceed.  History of Present Illness Ariel Bell is a 58 year old female with breast cancer who presents for follow-up after a mammogram.  Diagnosed with breast cancer in 2021, she underwent treatment including lymph node removal. She experiences occasional tenderness in the breast area, particularly in the armpit, which is positional and occurs when she lays in certain ways. No new lumps or changes in the breast have been noted. A recent mammogram conducted in September showed dense breast tissue but no new abnormalities.  She underwent a colonoscopy last year, which revealed some polyps. She denies any recent hospitalizations or new health issues.  Her current medications include Synthroid, hydrochlorothiazide, naproxen as needed, and Zyrtec as needed. She has discontinued Boniva. No leg swelling or new symptoms are reported.  She works part-time and resides in [place].  Rest of the pertinent 10 point ROS reviewed and negative.    COVID 19 VACCINATION STATUS: Not vaccinated as of 12/22/2020   HISTORY OF CURRENT ILLNESS: From the original intake  note:   Ariel Bell had routine screening mammography on 09/02/2020 showing a possible abnormality in the right breast. She underwent right diagnostic mammography with tomography and right breast ultrasonography at [facility] on 09/23/2020 showing: breast density category C; 5 mm mass involving upper-outer right breast with a vague shadowing focus which is likely the sonographic correlate; no pathologic right axillary lymphadenopathy.  Accordingly on 09/28/2020 she proceeded to biopsy of the right breast area in question. The pathology from this procedure ([id number]) showed: invasive and in situ mammary carcinoma, e-cadherin positive, grade 2. Prognostic indicators significant for: estrogen receptor, 100% positive and progesterone receptor, 90% positive, both with strong staining intensity. Proliferation marker Ki67 at 10%. HER2 equivocal by immunohistochemistry (2+), but negative by fluorescent in situ hybridization with a signals ratio 1.41 and number per cell 2.05.  The patient's subsequent history is as detailed below.   PAST MEDICAL HISTORY: Past Medical History:  Diagnosis Date   Breast cancer (HCC)    Heart murmur    History of radiation therapy 11/19/2020-12/15/2020   Right Breast IMRT; Dr. Retta Caster   Hypertension    Hypothyroidism    Personal history of radiation therapy     PAST SURGICAL HISTORY: Past Surgical History:  Procedure Laterality Date   AXILLARY SENTINEL NODE BIOPSY Right 10/15/2020   Procedure: RIGHT AXILLARY SENTINEL NODE BIOPSY;  Surgeon: Enid Harry, MD;  Location: [facility] OR;  Service: General;  Laterality: Right;   BREAST BIOPSY Right 08/2020   BREAST LUMPECTOMY Right 10/14/2020   BREAST LUMPECTOMY WITH RADIOACTIVE SEED AND SENTINEL LYMPH NODE BIOPSY Right 10/15/2020   Procedure: RIGHT BREAST LUMPECTOMY WITH RADIOACTIVE SEED;  Surgeon: Enid Harry, MD;  Location: [facility] OR;  Service: General;  Laterality: Right;  PEC BLOCK   COLONOSCOPY      WISDOM TOOTH EXTRACTION      FAMILY HISTORY: Family History  Problem Relation Age of Onset   COPD Father    Her father died in his 21's from COPD/E. Her mother is age 86 as of 09/2020. Ariel Bell has two brothers and one sister. There is no family history of cancer to her knowledge.    GYNECOLOGIC HISTORY:  No LMP recorded. Patient is postmenopausal. Menarche: 58 years old GX P 0 LMP age 72 HRT never used  Hysterectomy? no BSO? 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 She is divorced. She is currently staying with her niece Ariel Bell because she just sold her house, but she is planning to move out as soon as she finds her own place to live.    ADVANCED DIRECTIVES: not in place, plans to name her sister Ariel Bell as her HCPOA.   HEALTH MAINTENANCE: Social History   Tobacco Use   Smoking status: Former    Current packs/day: 0.00    Types: Cigarettes    Quit date: 1995    Years since quitting: 30.4   Smokeless tobacco: Never  Vaping Use   Vaping status: Never Used  Substance Use Topics   Alcohol use: Yes    Alcohol/week: 14.0 standard drinks of alcohol    Types: 14 Glasses of wine per week   Drug use: Never     Colonoscopy: 2018?  PAP: up to date  Bone density: osteopenia   No Known Allergies  Current Outpatient Medications  Medication Sig Dispense Refill   cetirizine (ZYRTEC) 10 MG tablet Take 10 mg by mouth daily as needed for allergies.     hydrochlorothiazide (HYDRODIURIL) 25 MG tablet Take 25 mg by mouth daily.     naproxen sodium (ALEVE) 220 MG tablet Take 220 mg by mouth daily as needed (mild pain).     SYNTHROID 75 MCG tablet Take 75 mcg by mouth daily before breakfast.     No current facility-administered medications for this visit.    OBJECTIVE: White woman who appears well  Vitals:   03/28/24 1000  BP: 129/67  Pulse: 74  Resp: 17  Temp: 98.9 F (37.2 C)  SpO2: 99%        Body mass index is 25.77 kg/m.   Wt Readings from Last 3 Encounters:  03/28/24 140 lb 14.4 oz (63.9 kg)  11/13/23 142 lb 4 oz (64.5 kg)  04/24/23 139 lb 8 oz (63.3 kg)      ECOG FS:1 - Symptomatic but completely ambulatory  Physical Exam Constitutional:      Appearance: Normal appearance.  Chest:     Comments: Bilateral breasts inspected.  Right breast with postradiation changes.  No palpable masses or regional adenopathy.  Left breast normal to inspection and palpation  Musculoskeletal:     Cervical back: Normal range of motion. No rigidity.  Lymphadenopathy:     Cervical: No cervical adenopathy.  Neurological:     Mental Status: She is alert.    LAB RESULTS:  CMP     Component Value Date/Time   NA 139 08/24/2022 1153   K 3.4 (L) 08/24/2022 1153   CL 103 08/24/2022 1153   CO2 30 08/24/2022 1153   GLUCOSE 125 (H) 08/24/2022 1153   BUN 15 08/24/2022 1153   CREATININE 0.78 08/24/2022 1153   CALCIUM 9.2 08/24/2022 1153   PROT 6.9 08/24/2022  1153   ALBUMIN  4.3 08/24/2022 1153   AST 19 08/24/2022 1153   ALT 15 08/24/2022 1153   ALKPHOS 68 08/24/2022 1153   BILITOT 1.1 08/24/2022 1153   GFRNONAA >60 08/24/2022 1153    No results found for: "TOTALPROTELP", "ALBUMINELP", "A1GS", "A2GS", "BETS", "BETA2SER", "GAMS", "MSPIKE", "SPEI"  Lab Results  Component Value Date   WBC 4.2 08/24/2022   NEUTROABS 2.1 08/24/2022   HGB 13.7 08/24/2022   HCT 40.0 08/24/2022   MCV 99.5 08/24/2022   PLT 192 08/24/2022    No results found for: "LABCA2"  No components found for: "ZOXWRU045"  No results for input(s): "INR" in the last 168 hours.  No results found for: "LABCA2"  No results found for: "WUJ811"  No results found for: "CAN125"  No results found for: "CAN153"  No results found for: "CA2729"  No components found for: "HGQUANT"  No results found for: "CEA1", "CEA" / No results found for: "CEA1", "CEA"   No results found for: "AFPTUMOR"  No results found for:  "CHROMOGRNA"  No results found for: "KPAFRELGTCHN", "LAMBDASER", "KAPLAMBRATIO" (kappa/lambda light chains)  No results found for: "HGBA", "HGBA2QUANT", "HGBFQUANT", "HGBSQUAN" (Hemoglobinopathy evaluation)   No results found for: "LDH"  No results found for: "IRON", "TIBC", "IRONPCTSAT" (Iron and TIBC)  No results found for: "FERRITIN"  Urinalysis No results found for: "COLORURINE", "APPEARANCEUR", "LABSPEC", "PHURINE", "GLUCOSEU", "HGBUR", "BILIRUBINUR", "KETONESUR", "PROTEINUR", "UROBILINOGEN", "NITRITE", "LEUKOCYTESUR"   STUDIES: No results found.   ELIGIBLE FOR AVAILABLE RESEARCH PROTOCOL: AET  ASSESSMENT: 58 y.o. [place] woman status post right breast upper outer quadrant biopsy 09/28/2020 for a clinical T1a N0, stage IA invasive ductal carcinoma, grade 2, estrogen and progesterone receptor positive, HER-2 negative, with an MIB-1 of 10%.  (a) the carcinoma measured 0.4 cm on the biopsy  (1) status post right lumpectomy and sentinel lymph node sampling 10/15/2020, showing no residual carcinoma in the breast, final pT1a pN0  (a) a total of one axillary lymph node removed  (2) adjuvant radiation 11/19/2020 through 12/15/2020  Site: Right breast Technique: 3D Total Dose (Gy): 42.72/42.72 Dose per Fx (Gy): 2.67 Completed Fx: 16/16 Beam Energies: 6X, 10X   (3) opted against antiestrogens.   PLAN:  Assessment and Plan Assessment & Plan Breast cancer Diagnosed in 2021. Intermittent axillary tenderness post-lymph node excision, no masses. - Order mammogram at breast center. - Instruct her to monitor for new lumps, increasing pain, or nipple changes and report if these occur. - She refused anti estrogens, she is on surveillance alone.  Osteoporosis Previously treated with Boniva, treatment discontinued. - Re-evaluate bone density in two years.  Time spent: 30 min  *Total Encounter Time as defined by the Centers for Medicare and Medicaid Services includes, in  addition to the face-to-face time of a patient visit (documented in the note above) non-face-to-face time: obtaining and reviewing outside history, ordering and reviewing medications, tests or procedures, care coordination (communications with other health care professionals or caregivers) and documentation in the medical record.

## 2024-05-13 ENCOUNTER — Ambulatory Visit: Payer: BC Managed Care – PPO | Attending: Hematology and Oncology

## 2024-05-13 VITALS — Wt 143.0 lb

## 2024-05-13 DIAGNOSIS — Z483 Aftercare following surgery for neoplasm: Secondary | ICD-10-CM | POA: Insufficient documentation

## 2024-05-13 NOTE — Therapy (Signed)
  OUTPATIENT PHYSICAL THERAPY SOZO SCREENING NOTE   Patient Name: Ariel Bell MRN: [id number] DOB:[birth year], 58 y.o., female Today's Date: 05/13/2024  PCP: Leva Rush, MD REFERRING PROVIDER: Loretha Ash, MD   PT End of Session - 05/13/24 1622     Visit Number 8   # unchanged due to screen only   PT Start Time 1420    PT Stop Time 1624    PT Time Calculation (min) 124 min    Activity Tolerance Patient tolerated treatment well    Behavior During Therapy [facility] for tasks assessed/performed          Past Medical History:  Diagnosis Date   Breast cancer (HCC)    Heart murmur    History of radiation therapy 11/19/2020-12/15/2020   Right Breast IMRT; Dr. Lynwood Nasuti   Hypertension    Hypothyroidism    Personal history of radiation therapy    Past Surgical History:  Procedure Laterality Date   AXILLARY SENTINEL NODE BIOPSY Right 10/15/2020   Procedure: RIGHT AXILLARY SENTINEL NODE BIOPSY;  Surgeon: Ebbie Cough, MD;  Location: [facility] OR;  Service: General;  Laterality: Right;   BREAST BIOPSY Right 08/2020   BREAST LUMPECTOMY Right 10/14/2020   BREAST LUMPECTOMY WITH RADIOACTIVE SEED AND SENTINEL LYMPH NODE BIOPSY Right 10/15/2020   Procedure: RIGHT BREAST LUMPECTOMY WITH RADIOACTIVE SEED;  Surgeon: Ebbie Cough, MD;  Location: [facility] OR;  Service: General;  Laterality: Right;  PEC BLOCK   COLONOSCOPY     WISDOM TOOTH EXTRACTION     Patient Active Problem List   Diagnosis Date Noted   Malignant neoplasm of upper-outer quadrant of right breast in female, estrogen receptor positive (HCC) 10/01/2020    REFERRING DIAG: right breast cancer at risk for lymphedema  THERAPY DIAG:  Aftercare following surgery for neoplasm  PERTINENT HISTORY: Patient was diagnosed on 09/02/2020 with 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 She underwent a right lumpectomy and sentinel node biopsy (1 negative node) on 10/15/2020 It is ER/PR positive and HER2  negative with a Ki67 of 10%.   PRECAUTIONS: right UE Lymphedema risk  SUBJECTIVE: Here for SOZO screen  PAIN:  Are you having pain? No  SOZO SCREENING: Patient was assessed today using the SOZO machine to determine the lymphedema index score. This was compared to her baseline score. It was determined that she is within the recommended range when compared to her baseline and no further action is needed at this time. She will continue SOZO screenings. These are done every 3 months for 2 years post operatively followed by every 6 months for 2 years, and then annually.   L-DEX FLOWSHEETS - 05/13/24 1600       L-DEX LYMPHEDEMA SCREENING   Measurement Type Unilateral    L-DEX MEASUREMENT EXTREMITY Upper Extremity    POSITION  Standing    DOMINANT SIDE Right    At Risk Side Right    BASELINE SCORE (UNILATERAL) -13    L-DEX SCORE (UNILATERAL) -8.6    VALUE CHANGE (UNILAT) 4.4         P: Transition to annual after next 6 month SOZO.    Berwyn Knights, PTA 05/13/24 4:23 PM

## 2024-07-22 ENCOUNTER — Ambulatory Visit
Admission: RE | Admit: 2024-07-22 | Discharge: 2024-07-22 | Disposition: A | Discharge status: Home / Self Care | Source: Ambulatory Visit | Attending: Hematology and Oncology

## 2024-07-22 DIAGNOSIS — Z170 Estrogen receptor positive status [ER+]: Secondary | ICD-10-CM

## 2024-11-11 ENCOUNTER — Ambulatory Visit: Attending: Hematology and Oncology

## 2024-11-11 VITALS — Wt 141.4 lb

## 2024-11-11 DIAGNOSIS — Z483 Aftercare following surgery for neoplasm: Secondary | ICD-10-CM | POA: Insufficient documentation

## 2024-11-11 NOTE — Therapy (Signed)
" °  OUTPATIENT PHYSICAL THERAPY SOZO SCREENING NOTE   Patient Name: Ariel Bell MRN: [id number] DOB:05-Dec-1965, 59 y.o., female Today's Date: 11/11/2024  PCP: Leva Rush, MD REFERRING PROVIDER: Loretha Ash, MD   PT End of Session - 11/11/24 1619     Visit Number 8   # unchanged due to screen only   PT Start Time 1617    PT Stop Time 1621    PT Time Calculation (min) 4 min    Activity Tolerance Patient tolerated treatment well    Behavior During Therapy [facility] for tasks assessed/performed          Past Medical History:  Diagnosis Date   Breast cancer (HCC)    Heart murmur    History of radiation therapy 11/19/2020-12/15/2020   Right Breast IMRT; Dr. Lynwood Nasuti   Hypertension    Hypothyroidism    Personal history of radiation therapy    Past Surgical History:  Procedure Laterality Date   AXILLARY SENTINEL NODE BIOPSY Right 10/15/2020   Procedure: RIGHT AXILLARY SENTINEL NODE BIOPSY;  Surgeon: Ebbie Cough, MD;  Location: [facility] OR;  Service: General;  Laterality: Right;   BREAST BIOPSY Right 08/2020   BREAST LUMPECTOMY Right 10/14/2020   BREAST LUMPECTOMY WITH RADIOACTIVE SEED AND SENTINEL LYMPH NODE BIOPSY Right 10/15/2020   Procedure: RIGHT BREAST LUMPECTOMY WITH RADIOACTIVE SEED;  Surgeon: Ebbie Cough, MD;  Location: [facility] OR;  Service: General;  Laterality: Right;  PEC BLOCK   COLONOSCOPY     WISDOM TOOTH EXTRACTION     Patient Active Problem List   Diagnosis Date Noted   Malignant neoplasm of upper-outer quadrant of right breast in female, estrogen receptor positive (HCC) 10/01/2020    REFERRING DIAG: right breast cancer at risk for lymphedema  THERAPY DIAG:  Aftercare following surgery for neoplasm  PERTINENT HISTORY: Patient was diagnosed on 09/02/2020 with 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 She underwent a right lumpectomy and sentinel node biopsy (1 negative node) on 10/15/2020 It is ER/PR positive and HER2 negative  with a Ki67 of 10%.   PRECAUTIONS: right UE Lymphedema risk  SUBJECTIVE: Here for first 6 month SOZO screen  PAIN:  Are you having pain? No  SOZO SCREENING: Patient was assessed today using the SOZO machine to determine the lymphedema index score. This was compared to her baseline score. It was determined that she is NOT within the recommended range when compared to her baseline. Pt will resume wear of her compression sleeve x30 days and return for a SOZO screen then.   Educated pt that she should wear her sleeve a few hours a day as part of her normal daily routine due to numerous high changes from baselines.    L-DEX FLOWSHEETS - 11/11/24 1600       L-DEX LYMPHEDEMA SCREENING   Measurement Type Unilateral    L-DEX MEASUREMENT EXTREMITY Upper Extremity    POSITION  Standing    DOMINANT SIDE Right    At Risk Side Right    BASELINE SCORE (UNILATERAL) -13    L-DEX SCORE (UNILATERAL) -6.3    VALUE CHANGE (UNILAT) 6.7         P: Pt to return in 1 month due to high change from baseline.   Berwyn Knights, PTA 11/11/2024 4:22 PM      "

## 2024-12-16 ENCOUNTER — Ambulatory Visit: Attending: Hematology and Oncology

## 2025-03-28 ENCOUNTER — Inpatient Hospital Stay: Admitting: Hematology and Oncology
